# Patient Record
Sex: Female | Born: 1970 | Race: Black or African American | Hispanic: No | Smoking: Former smoker
Health system: Southern US, Community
[De-identification: ages and names within clinical notes are randomized; demographics above are authoritative.]

## PROBLEM LIST (undated history)

## (undated) DIAGNOSIS — F419 Anxiety disorder, unspecified: Secondary | ICD-10-CM

## (undated) DIAGNOSIS — Z720 Tobacco use: Secondary | ICD-10-CM

## (undated) DIAGNOSIS — F32A Depression, unspecified: Secondary | ICD-10-CM

## (undated) DIAGNOSIS — F329 Major depressive disorder, single episode, unspecified: Secondary | ICD-10-CM

## (undated) DIAGNOSIS — Z9884 Bariatric surgery status: Secondary | ICD-10-CM

## (undated) HISTORY — PX: LAPAROSCOPIC ROUX-EN-Y GASTRIC BYPASS WITH UPPER ENDOSCOPY AND REMOVAL OF LAP BAND: SHX6505

## (undated) HISTORY — DX: Bariatric surgery status: Z98.84

## (undated) HISTORY — PX: CHOLECYSTECTOMY: SHX55

## (undated) HISTORY — DX: Tobacco use: Z72.0

---

## 2009-03-07 DIAGNOSIS — Z9884 Bariatric surgery status: Secondary | ICD-10-CM

## 2009-03-07 HISTORY — DX: Bariatric surgery status: Z98.84

## 2016-06-08 ENCOUNTER — Encounter (HOSPITAL_COMMUNITY): Payer: Self-pay | Admitting: Emergency Medicine

## 2016-06-08 ENCOUNTER — Emergency Department (HOSPITAL_COMMUNITY)
Admission: EM | Admit: 2016-06-08 | Discharge: 2016-06-08 | Disposition: A | Discharge status: Home / Self Care | Payer: BLUE CROSS/BLUE SHIELD | Attending: Emergency Medicine | Admitting: Emergency Medicine

## 2016-06-08 DIAGNOSIS — Z760 Encounter for issue of repeat prescription: Secondary | ICD-10-CM | POA: Insufficient documentation

## 2016-06-08 DIAGNOSIS — F419 Anxiety disorder, unspecified: Secondary | ICD-10-CM | POA: Insufficient documentation

## 2016-06-08 DIAGNOSIS — Z87891 Personal history of nicotine dependence: Secondary | ICD-10-CM | POA: Insufficient documentation

## 2016-06-08 HISTORY — DX: Depression, unspecified: F32.A

## 2016-06-08 HISTORY — DX: Major depressive disorder, single episode, unspecified: F32.9

## 2016-06-08 HISTORY — DX: Anxiety disorder, unspecified: F41.9

## 2016-06-08 MED ORDER — CLONAZEPAM 0.5 MG PO TABS
1.0000 mg | ORAL_TABLET | Freq: Every day | ORAL | Status: DC
  Administered 2016-06-08: 1 mg via ORAL
  Filled 2016-06-08: qty 2

## 2016-06-08 MED ORDER — HYDROXYZINE HCL 25 MG PO TABS: 25.0000 mg | ORAL_TABLET | Freq: Four times a day (QID) | ORAL | 0 refills | Status: DC

## 2016-06-08 NOTE — ED Provider Notes (Signed)
[facility]-EMER[facility]ENCY DEPT Provider Note   CSN: [id number] Arrival date & time: 06/08/16  0708     History   Chief Complaint Chief Complaint  Patient presents with  . Medication Refill    HPI Valkyrie Guardiola is a 46 y.o. female who presents with worsening anxiety. PMH significant for anxiety/depression. She is on Lexapro and Klonipin. She states she has been on benzos for "years". She is from [place] but was visiting family in [place] for Easter and came here afterwards for a job interview. She realized she lost her medication in [facility] and called her family but they were unable to find it. She has been out of her medicine for the past 4 days. Since then she has had worsening anxiety and neck and upper back  "stiffness". She states she has had these symptoms before when she has been out of her meds. She was unsure of what to do so she came to the ED. No SI/HI.  HPI  Past Medical History:  Diagnosis Date  . Anxiety   . Depression     There are no active problems to display for this patient.   Past Surgical History:  Procedure Laterality Date  . CHOLECYSTECTOMY    . LAPAROSCOPIC ROUX-EN-Y GASTRIC BYPASS WITH UPPER ENDOSCOPY AND REMOVAL OF LAP BAND      OB History    No data available       Home Medications    Prior to Admission medications   Not on File    Family History No family history on file.  Social History Social History  Substance Use Topics  . Smoking status: Former Smoker    Types: Cigarettes  . Smokeless tobacco: Never Used  . Alcohol use Yes     Allergies   Patient has no allergy information on record.   Review of Systems Review of Systems  Musculoskeletal: Positive for arthralgias, myalgias and neck pain.  Neurological: Positive for tremors.  Psychiatric/Behavioral: Negative for self-injury and suicidal ideas. The patient is nervous/anxious.   All other systems reviewed and are negative.    Physical Exam Updated Vital Signs BP (!) 142/106 (BP  Location: Left Arm)   Pulse 92   Temp 98.7 F (37.1 C) (Oral)   Resp 18   Ht  (1.626 m)   Wt 81.6 kg   SpO2 100%   BMI 30.90 kg/m   Physical Exam  Constitutional: She is oriented to person, place, and time. She appears well-developed and well-nourished. No distress.  Pleasant, cooperative  HENT:  Head: Normocephalic and atraumatic.  Eyes: Conjunctivae are normal. Pupils are equal, round, and reactive to light. Right eye exhibits no discharge. Left eye exhibits no discharge. No scleral icterus.  Neck: Normal range of motion.  Cardiovascular: Normal rate.   Pulmonary/Chest: Effort normal. No respiratory distress.  Abdominal: She exhibits no distension.  Neurological: She is alert and oriented to person, place, and time.  Skin: Skin is warm and dry.  Psychiatric: Her behavior is normal. Her mood appears anxious (and tearful at times).  Nursing note and vitals reviewed.    ED Treatments / Results  Labs (all labs ordered are listed, but only abnormal results are displayed) Labs Reviewed - No data to display  EKG  EKG Interpretation None       Radiology No results found.  Procedures Procedures (including critical care time)  Medications Ordered in ED Medications - No data to display   Initial Impression / Assessment and Plan / ED Course  I have reviewed the triage vital signs and the nursing notes.  Pertinent labs & imaging results that were available during my care of the patient were reviewed by me and considered in my medical decision making (see chart for details).  46 year old female presents with request for med refill and symptoms of mild withdrawal. She is mildly hypertensive but otherwise vitals are normal. Discussed I cannot refill this medicine since it is a controlled substance. She verbalized understanding. I did give one dose of her home med here to counteract any withdrawal symptoms. Also gave rx for Atarax. Advised follow up with PCP.  Final  Clinical Impressions(s) / ED Diagnoses   Final diagnoses:  Medication refill  Anxiety    New Prescriptions New Prescriptions   No medications on file     Bethel Born, PA-C 06/08/16 1503    Linwood Dibbles, MD 06/08/16 [phone]

## 2016-06-08 NOTE — ED Notes (Signed)
Discharge instructions, follow up care, and rx x1 reviewed with patient. Patient verbalized understanding. 

## 2016-06-08 NOTE — ED Notes (Signed)
ED Provider at bedside. 

## 2016-06-08 NOTE — ED Notes (Signed)
Bed: [facility] Expected date:  Expected time:  Means of arrival:  Comments: 

## 2016-06-08 NOTE — ED Triage Notes (Signed)
Patient flew from [place] for a job interview on Monday. Patient is on klonopin .1 mg. Patient has not had this medication since this Saturday. Patient reports back and neck pain. Patient is conscious, alert, oriented. Denies SI/HI.

## 2016-06-08 NOTE — Discharge Instructions (Signed)
Take Atarax as needed  Please call your doctor when you get home

## 2016-08-18 ENCOUNTER — Encounter (HOSPITAL_COMMUNITY): Payer: Self-pay | Admitting: Emergency Medicine

## 2016-08-18 ENCOUNTER — Ambulatory Visit (HOSPITAL_COMMUNITY): Admission: EM | Admit: 2016-08-18 | Discharge: 2016-08-18 | Discharge status: Absent without leave | Payer: Self-pay

## 2016-08-18 ENCOUNTER — Ambulatory Visit (HOSPITAL_COMMUNITY)
Admission: EM | Admit: 2016-08-18 | Discharge: 2016-08-18 | Disposition: A | Discharge status: Home / Self Care | Payer: BLUE CROSS/BLUE SHIELD | Attending: Internal Medicine | Admitting: Internal Medicine

## 2016-08-18 DIAGNOSIS — J209 Acute bronchitis, unspecified: Secondary | ICD-10-CM

## 2016-08-18 MED ORDER — BENZONATATE 100 MG PO CAPS: 100.0000 mg | ORAL_CAPSULE | Freq: Three times a day (TID) | ORAL | 0 refills | Status: DC

## 2016-08-18 MED ORDER — PREDNISONE 50 MG PO TABS: ORAL_TABLET | ORAL | 0 refills | Status: DC

## 2016-08-18 NOTE — Discharge Instructions (Signed)
I am treating you for bronchitis. I have prescribed a steroid called prednisone. Take one tablet daily with food. For cough, I have prescribed a medication called Tessalon. Take 1 tablet every 8 hours as needed for your cough. Should your symptoms fail to improve or worsen, follow up with your primary care provider, or return to clinic.

## 2016-08-18 NOTE — ED Triage Notes (Signed)
Pt reports a productive cough for the last three days.  Pt is a smoker and gets recurrent bronchitis.  Pt taking Robitussin OTC.

## 2016-08-18 NOTE — ED Provider Notes (Signed)
CSN: [id number]     Arrival date & time 08/18/16  1145 History   None    Chief Complaint  Patient presents with  . Cough   (Consider location/radiation/quality/duration/timing/severity/associated sxs/prior Treatment) The history is provided by the patient.  Cough  Cough characteristics:  Non-productive, dry and hacking Sputum characteristics:  Clear Severity:  Moderate Onset quality:  Gradual Duration:  3 days Timing:  Constant Chronicity:  Recurrent Smoker: yes   Context: smoke exposure and upper respiratory infection   Context: not exposure to allergens   Relieved by:  Cough suppressants Associated symptoms: sinus congestion and wheezing   Associated symptoms: no chest pain, no chills, no ear fullness, no ear pain, no fever, no rhinorrhea and no shortness of breath     Past Medical History:  Diagnosis Date  . Anxiety   . Depression    Past Surgical History:  Procedure Laterality Date  . CHOLECYSTECTOMY    . LAPAROSCOPIC ROUX-EN-Y GASTRIC BYPASS WITH UPPER ENDOSCOPY AND REMOVAL OF LAP BAND     History reviewed. No pertinent family history. Social History  Substance Use Topics  . Smoking status: Current Every Day Smoker    Packs/day: 0.50    Types: Cigarettes  . Smokeless tobacco: Never Used  . Alcohol use Yes   OB History    No data available     Review of Systems  Constitutional: Negative for chills and fever.  HENT: Negative for ear pain and rhinorrhea.   Respiratory: Positive for cough and wheezing. Negative for shortness of breath.   Cardiovascular: Negative for chest pain.  Gastrointestinal: Negative.   Musculoskeletal: Negative.   Skin: Negative.   Neurological: Negative.     Allergies  Patient has no known allergies.  Home Medications   Prior to Admission medications   Medication Sig Start Date End Date Taking? Authorizing Provider  busPIRone (BUSPAR) 10 MG tablet Take 10 mg by mouth 3 (three) times daily.   Yes [provider]   escitalopram (LEXAPRO) 20 MG tablet Take 20 mg by mouth daily.   Yes [provider]  hydrOXYzine (ATARAX/VISTARIL) 25 MG tablet Take 1 tablet (25 mg total) by mouth every 6 (six) hours. 06/08/16  Yes Bethel Born, PA-C  benzonatate (TESSALON) 100 MG capsule Take 1 capsule (100 mg total) by mouth every 8 (eight) hours. 08/18/16   Dorena Bodo, NP  predniSONE (DELTASONE) 50 MG tablet Take 1 tablet daily with food 08/18/16   Dorena Bodo, NP   Meds Ordered and Administered this Visit  Medications - No data to display  BP 115/79 (BP Location: Right Arm)   Pulse 86   Temp 98.1 F (36.7 C) (Oral)   SpO2 99%  No data found.   Physical Exam  Constitutional: She is oriented to person, place, and time. She appears well-developed and well-nourished. No distress.  HENT:  Head: Normocephalic and atraumatic.  Right Ear: Tympanic membrane and external ear normal.  Left Ear: Tympanic membrane and external ear normal.  Nose: Nose normal.  Mouth/Throat: Uvula is midline, oropharynx is clear and moist and mucous membranes are normal.  Eyes: Conjunctivae are normal.  Neck: Normal range of motion. Neck supple.  Cardiovascular: Normal rate and regular rhythm.   Pulmonary/Chest: Effort normal and breath sounds normal. She has no wheezes.  Lymphadenopathy:    She has no cervical adenopathy.  Neurological: She is alert and oriented to person, place, and time.  Skin: Skin is warm and dry. Capillary refill takes less than 2 seconds.  No rash noted. She is not diaphoretic. No erythema.  Psychiatric: She has a normal mood and affect. Her behavior is normal.  Nursing note and vitals reviewed.   Urgent Care Course     Procedures (including critical care time)  Labs Review Labs Reviewed - No data to display  Imaging Review No results found.      MDM   1. Acute bronchitis, unspecified organism    Given prednisone, Tessalon, provided counseling on over-the-counter  therapies for symptom management, follow-up with primary care or return to clinic as needed.    Dorena BodoKennard, Ahonesty Woodfin, NP 08/18/16 1317

## 2016-08-23 ENCOUNTER — Telehealth: Payer: Self-pay | Admitting: General Practice

## 2016-08-23 NOTE — Telephone Encounter (Signed)
Called to sched new pt appt with Dr. Earlene PlaterWallace, left message  -LL

## 2016-09-16 ENCOUNTER — Ambulatory Visit: Payer: BLUE CROSS/BLUE SHIELD | Admitting: Family Medicine

## 2016-09-29 ENCOUNTER — Ambulatory Visit (INDEPENDENT_AMBULATORY_CARE_PROVIDER_SITE_OTHER): Payer: 59 | Admitting: Family Medicine

## 2016-09-29 ENCOUNTER — Encounter: Payer: Self-pay | Admitting: Family Medicine

## 2016-09-29 VITALS — HR 90 | Ht 64.0 in | Wt 180.0 lb

## 2016-09-29 DIAGNOSIS — R05 Cough: Secondary | ICD-10-CM

## 2016-09-29 DIAGNOSIS — F17200 Nicotine dependence, unspecified, uncomplicated: Secondary | ICD-10-CM | POA: Insufficient documentation

## 2016-09-29 DIAGNOSIS — Z720 Tobacco use: Secondary | ICD-10-CM | POA: Diagnosis not present

## 2016-09-29 DIAGNOSIS — Z9884 Bariatric surgery status: Secondary | ICD-10-CM

## 2016-09-29 DIAGNOSIS — R059 Cough, unspecified: Secondary | ICD-10-CM | POA: Insufficient documentation

## 2016-09-29 DIAGNOSIS — F419 Anxiety disorder, unspecified: Secondary | ICD-10-CM

## 2016-09-29 HISTORY — DX: Tobacco use: Z72.0

## 2016-09-29 MED ORDER — HYDROXYZINE HCL 25 MG PO TABS: 25.0000 mg | ORAL_TABLET | Freq: Four times a day (QID) | ORAL | 3 refills | Status: DC

## 2016-09-29 MED ORDER — DOXYCYCLINE HYCLATE 100 MG PO TABS: 100.0000 mg | ORAL_TABLET | Freq: Two times a day (BID) | ORAL | 0 refills | Status: DC

## 2016-09-29 MED ORDER — ALBUTEROL SULFATE HFA 108 (90 BASE) MCG/ACT IN AERS: 2.0000 | INHALATION_SPRAY | Freq: Four times a day (QID) | RESPIRATORY_TRACT | 2 refills | Status: DC | PRN

## 2016-09-29 MED ORDER — BUSPIRONE HCL 10 MG PO TABS
10.0000 mg | ORAL_TABLET | Freq: Three times a day (TID) | ORAL | 3 refills | Status: DC
Start: 2016-09-29 — End: 2017-12-22

## 2016-09-29 MED ORDER — ESCITALOPRAM OXALATE 20 MG PO TABS
20.0000 mg | ORAL_TABLET | Freq: Every day | ORAL | 5 refills | Status: DC
Start: 2016-09-29 — End: 2017-07-06

## 2016-09-29 NOTE — Patient Instructions (Signed)
You will be contacted to schedule your bariatric appointment.  Have a wonderful day!

## 2016-09-29 NOTE — Progress Notes (Signed)
Samantha RedwoodDionne Kyung RuddKennedy is a 46 y.o. [facility]   Patient Care Team: Helane RimaWallace, Latreece Mochizuki, DO as PCP - General (Family Medicine)   History of Present Illness:   Samantha ArtAmber Agner, CMA, acting as scribe for Dr. Earlene PlaterWallace.  HPI:  Patient [place]  She would like to establish care today.  States she has a chronic cough that is productive of sputum.  She was treated with a steroid and Tessalon.  The cough has not improved.  Patient is a smoker.  She has some intermittent wheezing.  Cough is worse at night.  Some difficulty taking a deep breath.  Patient takes hydroxyzine as needed.  States she has a history of being treated with Xanax and Lexapro.  She abruptly stopped the Xanax after being on it for 5 years because she ran out of it and she was very sick.  She ended up in the ER and was started on Buspar.  She has not noticed that it has made that much of a difference.    Patient also states she has a lap band and would like referral to a bariatric specialist in order to have this maintained.  She had the lap band placed in November 2011.   Patient is a smoker.  She has history of smoking and then stopped cold [place].  She started smoking again 6 months ago.  States she is not ready to quit at this time.   Health Maintenance Due  Topic Date Due  . TETANUS/TDAP  02/10/1989   Depression screen PHQ 2/9 09/29/2016  Decreased Interest 0  Down, Depressed, Hopeless 0  PHQ - 2 Score 0   PMHx, SurgHx, SocialHx, Medications, and Allergies were reviewed in the Visit Navigator and updated as appropriate.   Past Medical History:  Diagnosis Date  . Anxiety   . Depression   . 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 Tobacco use 09/29/2016   Past Surgical History:  Procedure Laterality Date  . CHOLECYSTECTOMY    . LAPAROSCOPIC ROUX-EN-Y GASTRIC BYPASS WITH UPPER ENDOSCOPY AND REMOVAL OF LAP BAND     History reviewed. No  pertinent family history. Social History  Substance Use Topics  . Smoking status: Current Every Day Smoker    Packs/day: 0.50    Types: Cigarettes  . Smokeless tobacco: Never Used  . Alcohol use 8.4 oz/week    14 Glasses of wine per week   Current Medications and Allergies:   .  busPIRone (BUSPAR) 10 MG tablet, Take 1 tablet (10 mg total) by mouth 3 (three) times daily., Disp: 90 tablet, Rfl: 3 .  escitalopram (LEXAPRO) 20 MG tablet, Take 1 tablet (20 mg total) by mouth daily., Disp: 30 tablet, Rfl: 5 .  hydrOXYzine (ATARAX/VISTARIL) 25 MG tablet, Take 1 tablet (25 mg total) by mouth every 6 (six) hours., Disp: 30 tablet, Rfl: 3  Allergies  Allergen Reactions  . Morphine And Related Nausea Only   Review of Systems:   Pertinent items are noted in the HPI. Otherwise, ROS is negative.  Vitals:   Vitals:   09/29/16 1336  Pulse: 90  SpO2: 97%  Weight: 180 lb (81.6 kg)  Height: 5\' 4"  (1.626 m)     Body mass index is 30.9 kg/m.  Physical Exam:   Physical Exam  Constitutional: She appears well-developed and well-nourished. No distress.  HENT:  Head: Normocephalic and atraumatic.  Eyes: Pupils are equal, round, and reactive to  light. EOM are normal.  Neck: Normal range of motion. Neck supple.  Cardiovascular: Normal rate, regular rhythm, normal heart sounds and intact distal pulses.   Pulmonary/Chest: Effort normal. She has wheezes. She has rhonchi in the right lower field.  Abdominal: Soft.  Skin: Skin is warm.  Psychiatric: She has a normal mood and affect. Her behavior is normal.  Nursing note and vitals reviewed.   Assessment and Plan:   Samantha Duarte was seen today for establish care.  Diagnoses and all orders for this visit:  Tobacco use Comments: Precontemplative. I advised patient to quit smoking, and offered support. [place] QUITLINE: 1-800-QUIT-NOW [phone](1-6232448932).  Anxiety Comments: Doing well. Okay to stop Buspar if not helping.  Orders: -     escitalopram  (LEXAPRO) 20 MG tablet; Take 1 tablet (20 mg total) by mouth daily. -     hydrOXYzine (ATARAX/VISTARIL) 25 MG tablet; Take 1 tablet (25 mg total) by mouth every 6 (six) hours prn anxiety. -     busPIRone (BUSPAR) 10 MG tablet; Take 1 tablet (10 mg total) by mouth 3 (three) times daily.  History of laparoscopic adjustable gastric banding Comments: No management in years. Wants re-evaluation.  Orders: -     Amb Referral to Bariatric Surgery  Cough Comments: Concern for development into bronchopneumonia. Reviewed symptomatic care and red flags. See below orders. Orders: -     doxycycline (VIBRA-TABS) 100 MG tablet; Take 1 tablet (100 mg total) by mouth 2 (two) times daily. -     albuterol (PROVENTIL HFA;VENTOLIN HFA) 108 (90 Base) MCG/ACT inhaler; Inhale 2 puffs into the lungs every 6 (six) hours as needed for wheezing or shortness of breath.    . Reviewed expectations re: course of current medical issues. . Discussed self-management of symptoms. . Outlined signs and symptoms indicating need for more acute intervention. . Patient verbalized understanding and all questions were answered. Marland Kitchen. Health Maintenance issues including appropriate healthy diet, exercise, and smoking avoidance were discussed with patient. . See orders for this visit as documented in the electronic medical record. . Patient received an After Visit Summary.  CMA served as Neurosurgeonscribe during this visit. History, Physical, and Plan performed by medical provider. The above documentation has been reviewed and is accurate and complete. Helane RimaErica Tyrin Herbers, D.O.  Helane RimaErica Amairany Schumpert, DO [place], [facility] 09/30/2016  Future Appointments Date Time Provider Department Center  03/29/2017 2:45 PM Helane RimaWallace, Kaliann Coryell, DO [facility] None

## 2016-09-30 ENCOUNTER — Encounter: Payer: Self-pay | Admitting: Family Medicine

## 2017-03-29 ENCOUNTER — Ambulatory Visit (INDEPENDENT_AMBULATORY_CARE_PROVIDER_SITE_OTHER): Payer: 59 | Admitting: Family Medicine

## 2017-03-29 ENCOUNTER — Encounter: Payer: Self-pay | Admitting: Family Medicine

## 2017-03-29 VITALS — BP 108/72 | HR 92 | Temp 98.3°F | Ht 64.0 in | Wt 166.8 lb

## 2017-03-29 DIAGNOSIS — Z9884 Bariatric surgery status: Secondary | ICD-10-CM | POA: Diagnosis not present

## 2017-03-29 DIAGNOSIS — Z13220 Encounter for screening for lipoid disorders: Secondary | ICD-10-CM

## 2017-03-29 DIAGNOSIS — E559 Vitamin D deficiency, unspecified: Secondary | ICD-10-CM

## 2017-03-29 DIAGNOSIS — R634 Abnormal weight loss: Secondary | ICD-10-CM | POA: Diagnosis not present

## 2017-03-29 DIAGNOSIS — Z0000 Encounter for general adult medical examination without abnormal findings: Secondary | ICD-10-CM

## 2017-03-29 DIAGNOSIS — F17200 Nicotine dependence, unspecified, uncomplicated: Secondary | ICD-10-CM | POA: Diagnosis not present

## 2017-03-29 DIAGNOSIS — N912 Amenorrhea, unspecified: Secondary | ICD-10-CM | POA: Diagnosis not present

## 2017-03-29 DIAGNOSIS — Z79899 Other long term (current) drug therapy: Secondary | ICD-10-CM | POA: Diagnosis not present

## 2017-03-29 MED ORDER — NICOTINE 14 MG/24HR TD PT24: 14.0000 mg | MEDICATED_PATCH | Freq: Every day | TRANSDERMAL | 5 refills | Status: DC

## 2017-03-29 NOTE — Patient Instructions (Signed)
   [place] QUITLINE: 1-800-QUIT-NOW (1-800-784-8669)  

## 2017-03-29 NOTE — Progress Notes (Signed)
Subjective:    Samantha Duarte is a 47 y.o. female and is here for a comprehensive physical exam.  Pertinent Gynecological History: No LMP recorded. History of ablation.  There are no preventive care reminders to display for this patient.   PMHx, SurgHx, SocialHx, Medications, and Allergies were reviewed in the Visit Navigator and updated as appropriate.   Past Medical History:  Diagnosis Date  . Anxiety   . Depression   . 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 Tobacco use 09/29/2016    Past Surgical History:  Procedure Laterality Date  . CHOLECYSTECTOMY    . LAPAROSCOPIC ROUX-EN-Y GASTRIC BYPASS WITH UPPER ENDOSCOPY AND REMOVAL OF LAP BAND     No family history on file. Social History   Tobacco Use  . Smoking status: Current Every Day Smoker    Packs/day: 0.50    Types: Cigarettes  . Smokeless tobacco: Never Used  Substance Use Topics  . Alcohol use: Yes    Alcohol/week: 8.4 oz    Types: 14 Glasses of wine per week  . Drug use: No    Review of Systems:   Pertinent items are noted in the HPI. Otherwise, ROS is negative.  Objective:   BP 108/72   Pulse 92   Temp 98.3 F (36.8 C) (Oral)   Ht 5\' 4"  (1.626 m)   Wt 166 lb 12.8 oz (75.7 kg)   SpO2 99%   BMI 28.63 kg/m    Wt Readings from Last 3 Encounters:  03/29/17 166 lb 12.8 oz (75.7 kg)  09/29/16 180 lb (81.6 kg)  06/08/16 180 lb (81.6 kg)     Ht Readings from Last 3 Encounters:  03/29/17 5\' 4"  (1.626 m)  09/29/16 5\' 4"  (1.626 m)  06/08/16 5\' 4"  (1.626 m)    General appearance: alert, cooperative and appears stated age. Head: normocephalic, without obvious abnormality, atraumatic. Neck: no adenopathy, supple, symmetrical, trachea midline; thyroid not enlarged, symmetric, no tenderness/mass/nodules. Lungs: clear to auscultation bilaterally. Heart: regular rate and rhythm Abdomen: soft, non-tender; no masses,  no organomegaly. Extremities: extremities normal, atraumatic,  no cyanosis or edema. Skin: skin color, texture, turgor normal, no rashes or lesions. Lymph: cervical, supraclavicular, and axillary nodes normal; no abnormal inguinal nodes palpated. Neurologic: grossly normal.                                 Assessment/Plan:   Diagnoses and all orders for this visit:  Routine physical examination -     CBC with Differential/Platelet -     Comprehensive metabolic panel  Smoker Comments: Action stage of change. See below treatment. QUITLINE information provided.  Orders: -     nicotine (NICODERM CQ - DOSED IN MG/24 HOURS) 14 mg/24hr patch; Place 1 patch (14 mg total) onto the skin daily.  History of laparoscopic adjustable gastric banding Comments: Not taking multivitamins. Reminder to take them and will check vitamin/minerals. Orders: -     Vitamin B12 -     Ambulatory referral to General Surgery  Screening for lipid disorders -     Lipid panel  Weight loss Comments: Continues. Unintentional. Needs recheck, Hx of lap band. Will put in referral again.  Orders: -     TSH  Vitamin D deficiency -     VITAMIN D 25 Hydroxy (Vit-D Deficiency, Fractures)  Medication management -     Vitamin B12  Amenorrhea Comments: Patient with vasomotor symptoms. Hx  of uterine ablation. Will check FSH/LH to confirm menopausal status.  Orders: -     FSH -     Luteinizing hormone    Patient Counseling: [x]    Nutrition: Stressed importance of moderation in sodium/caffeine intake, saturated fat and cholesterol, caloric balance, sufficient intake of fresh fruits, vegetables, fiber, calcium, iron, and 1 mg of folate supplement per day (for females capable of pregnancy).  [x]    Stressed the importance of regular exercise.   [x]    Substance Abuse: Discussed cessation/primary prevention of tobacco, alcohol, or other drug use; driving or other dangerous activities under the influence; availability of treatment for abuse.   [x]    Injury prevention: Discussed  safety belts, safety helmets, smoke detector, smoking near bedding or upholstery.   [x]    Sexuality: Discussed sexually transmitted diseases, partner selection, use of condoms, avoidance of unintended pregnancy  and contraceptive alternatives.  [x]    Dental health: Discussed importance of regular tooth brushing, flossing, and dental visits.  [x]    Health maintenance and immunizations reviewed. Please refer to Health maintenance section.   Helane RimaErica Sejla Marzano, DO [place] [facility]

## 2017-03-30 LAB — LIPID PANEL
Cholesterol: 180 mg/dL (ref 0–200)
HDL: 87.6 mg/dL (ref >39.00)
LDL Cholesterol: 72 mg/dL (ref 0–99)
NonHDL: 92.3
Total CHOL/HDL Ratio: 2
Triglycerides: 103 mg/dL (ref 0.0–149.0)
VLDL: 20.6 mg/dL (ref 0.0–40.0)

## 2017-03-30 LAB — CBC WITH DIFFERENTIAL/PLATELET
Basophils Absolute: 0.1 10*3/uL (ref 0.0–0.1)
Basophils Relative: 1 % (ref 0.0–3.0)
Eosinophils Absolute: 0.3 10*3/uL (ref 0.0–0.7)
Eosinophils Relative: 4.4 % (ref 0.0–5.0)
HCT: 41.2 % (ref 36.0–46.0)
Hemoglobin: 14.1 g/dL (ref 12.0–15.0)
Lymphocytes Relative: 47.8 % — ABNORMAL HIGH (ref 12.0–46.0)
Lymphs Abs: 3 10*3/uL (ref 0.7–4.0)
MCHC: 34.3 g/dL (ref 30.0–36.0)
MCV: 98.6 fl (ref 78.0–100.0)
Monocytes Absolute: 0.4 10*3/uL (ref 0.1–1.0)
Monocytes Relative: 6.1 % (ref 3.0–12.0)
Neutro Abs: 2.6 10*3/uL (ref 1.4–7.7)
Neutrophils Relative %: 40.7 % — ABNORMAL LOW (ref 43.0–77.0)
Platelets: 262 10*3/uL (ref 150.0–400.0)
RBC: 4.18 Mil/uL (ref 3.87–5.11)
RDW: 14.8 % (ref 11.5–15.5)
WBC: 6.4 10*3/uL (ref 4.0–10.5)

## 2017-03-30 LAB — COMPREHENSIVE METABOLIC PANEL
ALT: 9 U/L (ref 0–35)
AST: 13 U/L (ref 0–37)
Albumin: 4.2 g/dL (ref 3.5–5.2)
Alkaline Phosphatase: 62 U/L (ref 39–117)
BUN: 14 mg/dL (ref 6–23)
CO2: 30 mEq/L (ref 19–32)
Calcium: 9.6 mg/dL (ref 8.4–10.5)
Chloride: 104 mEq/L (ref 96–112)
Creatinine, Ser: 0.94 mg/dL (ref 0.40–1.20)
GFR: 82.04 mL/min (ref >60.00)
Glucose, Bld: 82 mg/dL (ref 70–99)
Potassium: 3.9 mEq/L (ref 3.5–5.1)
Sodium: 140 mEq/L (ref 135–145)
Total Bilirubin: 0.4 mg/dL (ref 0.2–1.2)
Total Protein: 7.1 g/dL (ref 6.0–8.3)

## 2017-03-30 LAB — LUTEINIZING HORMONE: LH: 26.53 m[IU]/mL

## 2017-03-30 LAB — VITAMIN D 25 HYDROXY (VIT D DEFICIENCY, FRACTURES): VITD: 32.02 ng/mL (ref 30.00–100.00)

## 2017-03-30 LAB — VITAMIN B12: Vitamin B-12: 341 pg/mL (ref 211–911)

## 2017-03-30 LAB — FOLLICLE STIMULATING HORMONE: FSH: 51.1 m[IU]/mL

## 2017-03-30 LAB — TSH: TSH: 0.38 u[IU]/mL (ref 0.35–4.50)

## 2017-04-02 ENCOUNTER — Encounter: Payer: Self-pay | Admitting: Family Medicine

## 2017-07-06 ENCOUNTER — Other Ambulatory Visit: Payer: Self-pay | Admitting: Family Medicine

## 2017-07-06 DIAGNOSIS — F419 Anxiety disorder, unspecified: Secondary | ICD-10-CM

## 2017-10-09 ENCOUNTER — Encounter: Payer: Self-pay | Admitting: Family Medicine

## 2017-10-09 ENCOUNTER — Ambulatory Visit (INDEPENDENT_AMBULATORY_CARE_PROVIDER_SITE_OTHER): Payer: 59 | Admitting: Family Medicine

## 2017-10-09 VITALS — BP 110/68 | HR 101 | Temp 98.6°F | Ht 64.0 in | Wt 154.0 lb

## 2017-10-09 DIAGNOSIS — M5431 Sciatica, right side: Secondary | ICD-10-CM | POA: Diagnosis not present

## 2017-10-09 DIAGNOSIS — F419 Anxiety disorder, unspecified: Secondary | ICD-10-CM

## 2017-10-09 DIAGNOSIS — F5104 Psychophysiologic insomnia: Secondary | ICD-10-CM

## 2017-10-09 DIAGNOSIS — F17210 Nicotine dependence, cigarettes, uncomplicated: Secondary | ICD-10-CM | POA: Diagnosis not present

## 2017-10-09 MED ORDER — TRAZODONE HCL 50 MG PO TABS: 25.0000 mg | ORAL_TABLET | Freq: Every evening | ORAL | 2 refills | Status: DC | PRN

## 2017-10-09 MED ORDER — CYCLOBENZAPRINE HCL 5 MG PO TABS: 5.0000 mg | ORAL_TABLET | Freq: Three times a day (TID) | ORAL | 0 refills | Status: DC | PRN

## 2017-10-09 MED ORDER — KETOROLAC TROMETHAMINE 10 MG PO TABS: 10.0000 mg | ORAL_TABLET | Freq: Four times a day (QID) | ORAL | 0 refills | Status: DC | PRN

## 2017-10-09 MED ORDER — PREDNISONE 5 MG PO TABS: 5.0000 mg | ORAL_TABLET | Freq: Every day | ORAL | 0 refills | Status: DC

## 2017-10-09 NOTE — Progress Notes (Signed)
Samantha Duarte is a 47 y.o. female here for an acute visit.  History of Present Illness:   Samantha Duarte, CMA acting as scribe for Dr. Helane RimaErica Derrisha Foos.   HPI: Patient in office for evaluation of back pain. Pain started after plain ride to have pain into back of thigh front of shin to top of foot with some numbness into toes. She has tried ibuprofen, flexeril and heat with very little improvement.   PMHx, SurgHx, SocialHx, Medications, and Allergies were reviewed in the Visit Navigator and updated as appropriate.  Current Medications:    .  busPIRone (BUSPAR) 10 MG tablet, Take 1 tablet (10 mg total) by mouth 3 (three) times daily., Disp: 90 tablet, Rfl: 3 .  escitalopram (LEXAPRO) 20 MG tablet, TAKE 1 TABLET (20 MG TOTAL) BY MOUTH DAILY., Disp: 30 tablet, Rfl: 5 .  hydrOXYzine (ATARAX/VISTARIL) 25 MG tablet, Take 1 tablet (25 mg total) by mouth every 6 (six) hours., Disp: 30 tablet, Rfl: 3 .  ranitidine (ZANTAC) 150 MG capsule, Take 150 mg by mouth 2 (two) times daily., Disp: , Rfl:    Allergies  Allergen Reactions  . Morphine And Related Nausea Only   Review of Systems:   Pertinent items are noted in the HPI. Otherwise, ROS is negative.  Vitals:   Vitals:   10/09/17 1052  BP: 110/68  Pulse: (!) 101  Temp: 98.6 F (37 C)  TempSrc: Oral  SpO2: 100%  Weight: 154 lb (69.9 kg)  Height: 5\' 4"  (1.626 m)     Body mass index is 26.43 kg/m.  Physical Exam:   Physical Exam  Constitutional: She appears well-nourished.  HENT:  Head: Normocephalic and atraumatic.  Eyes: Pupils are equal, round, and reactive to light. EOM are normal.  Neck: Normal range of motion. Neck supple.  Cardiovascular: Normal rate, regular rhythm, normal heart sounds and intact distal pulses.  Pulmonary/Chest: Effort normal.  Abdominal: Soft.  Musculoskeletal:       Lumbar back: She exhibits decreased range of motion, pain and spasm.       Back:  Skin: Skin is warm.  Psychiatric: She has a  normal mood and affect. Her behavior is normal.  Nursing note and vitals reviewed.   Assessment and Plan:   Samantha Duarte was seen today for back pain.  Diagnoses and all orders for this visit:  Anxiety Comments: Situational. Worsened. Discussed the importance of self-care. Offered referral to [facility]. She accepted.  Right sided sciatica Comments: NEW. With concern for mild foot drop. See orders below. Will need imaging if not improving.  Orders: -     cyclobenzaprine (FLEXERIL) 5 MG tablet; Take 1 tablet (5 mg total) by mouth 3 (three) times daily as needed for muscle spasms. -     predniSONE (DELTASONE) 5 MG tablet; Take 1 tablet (5 mg total) by mouth daily with breakfast. 6-5-4-3-2-1 -     ketorolac (TORADOL) 10 MG tablet; Take 1 tablet (10 mg total) by mouth every 6 (six) hours as needed.  Psychophysiological insomnia Comments: See orders.  Orders: -     traZODone (DESYREL) 50 MG tablet; Take 0.5-1 tablets (25-50 mg total) by mouth at bedtime as needed for sleep.  Cigarette nicotine dependence without complication Comments: Precontemplative.     . Reviewed expectations re: course of current medical issues. . Discussed self-management of symptoms. . Outlined signs and symptoms indicating need for more acute intervention. . Patient verbalized understanding and all questions were answered. Marland Kitchen. Health Maintenance issues including appropriate healthy  diet, exercise, and smoking avoidance were discussed with patient. . See orders for this visit as documented in the electronic medical record. . Patient received an After Visit Summary.  CMA served as Neurosurgeon during this visit. History, Physical, and Plan performed by medical provider. The above documentation has been reviewed and is accurate and complete. Helane Rima, D.O.  Helane Rima, DO [place] 10/11/2017

## 2017-10-11 ENCOUNTER — Encounter: Payer: Self-pay | Admitting: Family Medicine

## 2017-10-18 ENCOUNTER — Ambulatory Visit (INDEPENDENT_AMBULATORY_CARE_PROVIDER_SITE_OTHER): Payer: 59 | Admitting: Sports Medicine

## 2017-10-18 ENCOUNTER — Ambulatory Visit (INDEPENDENT_AMBULATORY_CARE_PROVIDER_SITE_OTHER): Payer: 59

## 2017-10-18 ENCOUNTER — Encounter: Payer: Self-pay | Admitting: Sports Medicine

## 2017-10-18 VITALS — BP 112/84 | HR 96 | Ht 64.0 in | Wt 155.4 lb

## 2017-10-18 DIAGNOSIS — M5416 Radiculopathy, lumbar region: Secondary | ICD-10-CM

## 2017-10-18 DIAGNOSIS — M5136 Other intervertebral disc degeneration, lumbar region: Secondary | ICD-10-CM | POA: Diagnosis not present

## 2017-10-18 NOTE — Patient Instructions (Signed)

## 2017-10-18 NOTE — Progress Notes (Signed)
Samantha Duarte D. Delorise Shinerigby, DO  [facility] [place] [name]  Samantha PyoDionne Duarte - 47 y.o. female MRN [id number]  Date of birth: 11/16/1970  Visit Date: 10/18/2017  PCP: Helane RimaWallace, Erica, DO   Referred by: Helane RimaWallace, Erica, DO  Scribe(s) for today's visit: Christoper FabianMolly Weber, LAT, ATC  SUBJECTIVE:  Samantha PyoDionne Samantha Duarte is here for New Patient (Initial Visit) (Low back pain w/ R sciatica)  Her low back and R LE symptoms INITIALLY: Began about 2 weeks ago after a long [place] ride from [place] when she started having back pain w/ radiating pain into her R post thigh and R ant tibia and foot.  The radiating R LE pain has resolved but she now notes some weakness in her R ankle dorsiflexors.  She also reports almost falling several times.  She states that in trying to prevent herself from falling, she jarred her L hip which is also currently bothering her. Described as 6/10 dull,achy throbbing pain in the L hip and R lumbar spine, nonradiating Worsened with walking Improved with rest and Toradol Additional associated symptoms include: N/T into R LE and R ankle DF weakness; no change in B/B and no change in symptoms w/ coughing/sneezing    At this time symptoms are slightly better due to improved R ankle DF strength compared to onset. She has been taking Flexeril 5mg  and Toradol 10 mg.  She has finished her prednisone.   REVIEW OF SYSTEMS: Reports night time disturbances. Denies fevers, chills, or night sweats. Denies unexplained weight loss. Denies personal history of cancer. Denies changes in bowel or bladder habits. Denies recent unreported falls. Denies new or worsening dyspnea or wheezing. Denies headaches or dizziness.  Reports numbness, tingling or weakness  In the extremities - in the R LE Denies dizziness or presyncopal episodes Denies lower extremity edema    HISTORY & PERTINENT PRIOR DATA:  Prior History reviewed and updated per electronic medical record.    Significant/pertinent history, findings, studies include:  reports that she has been smoking cigarettes. She has been smoking about 0.50 packs per day. She has never used smokeless tobacco. No results for input(s): HGBA1C, LABURIC, CREATINE in the last 8760 hours. No specialty comments available. No problems updated.  OBJECTIVE:  VS:  HT:5\' 4"  (162.6 cm)   WT:155 lb 6.4 oz (70.5 kg)  BMI:26.66    BP:112/84  HR:96bpm  TEMP: ( )  RESP:97 %   PHYSICAL EXAM: CONSTITUTIONAL: Well-developed, Well-nourished and In no acute distress Alert & appropriately interactive. and Not depressed or anxious appearing. Respiratory: No increased work of breathing.  Trachea Midline EYES: Pupils are equal., EOM intact without nystagmus. and No scleral icterus.  Lower EXTREMITY EXAM: Warm and well perfused Edema: No significant swelling or edema NEURO: unremarkable, Abnormal strength in Right hip flexors, knee extensors, ankle dorsiflexors and plantar flexors, Abnormal sensation in the L3 distribution dermatome., Abnormal DTR in the Right patellar and Achilles diminished at 1+/4.  MSK Exam: Negative suprapatellar reflex, Negative straight leg raise, no associated pain with eliciting maneuvers.  PROCEDURES & DATA REVIEWED:  none  ASSESSMENT   1. Lumbar back pain with radiculopathy affecting right lower extremity     PLAN:  Given the marked weakness and difficulty with lack of pain further diagnostic evaluation indicated at this time including plain film x-rays today and MRI of her lumbar spine.  If any lack of improvement can consider referral for nerve conduction studies after 3-week time.  She will call us if any  worsening symptoms and we will plan to send in a longer taper steroid for her if indicated.  Orders Placed This Encounter  Procedures  . DG Lumbar Spine Complete  . MR Lumbar Spine Wo Contrast  No orders of the defined types were placed in this encounter.    Follow-up: Return for  MRI results review.      Please see additional documentation for Objective, Assessment and Plan sections. Pertinent additional documentation may be included in corresponding procedure notes, imaging studies, problem based documentation and patient instructions. Please see these sections of the encounter for additional information regarding this visit.  CMA/ATC served as Neurosurgeonscribe during this visit. History, Physical, and Plan performed by medical provider. Documentation and orders reviewed and attested to.      Andrena MewsMichael D Brenden Rudman, DO    [facility] Physician

## 2017-11-01 ENCOUNTER — Ambulatory Visit
Admission: RE | Admit: 2017-11-01 | Discharge: 2017-11-01 | Disposition: A | Discharge status: Home / Self Care | Payer: 59 | Source: Ambulatory Visit | Attending: Sports Medicine | Admitting: Sports Medicine

## 2017-11-01 DIAGNOSIS — M545 Low back pain: Secondary | ICD-10-CM | POA: Diagnosis not present

## 2017-11-01 DIAGNOSIS — M5416 Radiculopathy, lumbar region: Secondary | ICD-10-CM

## 2017-11-13 ENCOUNTER — Telehealth: Payer: Self-pay

## 2017-11-13 NOTE — Telephone Encounter (Signed)
Copied from CRM [id number]. Topic: General - Other >> Nov 13, 2017 11:11 AM Marylen Ponto wrote: Reason for CRM: Pt called in for MRI results. Pt request call back.

## 2017-11-13 NOTE — Telephone Encounter (Signed)
Called pt and left VM to call the office. See CRM.

## 2017-11-16 NOTE — Telephone Encounter (Signed)
Spoke with patient and briefly discussed results. A copy has been faxed to pt at [phone] (marked confidential). OV has been scheduled for 11/27/17 to f/u with Dr. Berline Choughigby regarding MR results.

## 2017-11-22 NOTE — Progress Notes (Signed)
Has appointment 9/23 to go over results. [facility]

## 2017-11-23 ENCOUNTER — Other Ambulatory Visit: Payer: Self-pay | Admitting: Family Medicine

## 2017-11-23 ENCOUNTER — Encounter: Payer: Self-pay | Admitting: Sports Medicine

## 2017-11-23 ENCOUNTER — Ambulatory Visit (INDEPENDENT_AMBULATORY_CARE_PROVIDER_SITE_OTHER): Payer: 59 | Admitting: Sports Medicine

## 2017-11-23 VITALS — BP 150/100 | HR 84 | Ht 64.0 in | Wt 149.4 lb

## 2017-11-23 DIAGNOSIS — M5416 Radiculopathy, lumbar region: Secondary | ICD-10-CM | POA: Diagnosis not present

## 2017-11-23 DIAGNOSIS — M4727 Other spondylosis with radiculopathy, lumbosacral region: Secondary | ICD-10-CM | POA: Diagnosis not present

## 2017-11-23 DIAGNOSIS — M5431 Sciatica, right side: Secondary | ICD-10-CM

## 2017-11-23 DIAGNOSIS — M21371 Foot drop, right foot: Secondary | ICD-10-CM | POA: Diagnosis not present

## 2017-11-23 MED ORDER — GABAPENTIN 300 MG PO CAPS: ORAL_CAPSULE | ORAL | 1 refills | Status: DC

## 2017-11-23 NOTE — Progress Notes (Signed)
Samantha Duarte. Sam[facility] at [facility] [phone]  Samantha Duarte - 47 y.o. female MRN [id number]  Date of birth: 10-31-1970  Visit Date: 11/23/2017  PCP: Helane Rima, DO   Referred by: Helane Rima, DO  Scribe(s) for today's visit: Stevenson Clinch, CMA  SUBJECTIVE:  Samantha Duarte is here for Follow-up (LBP)   10/18/2017: Her low back and R LE symptoms INITIALLY: Began about 2 weeks ago after a long plane ride from [place] when she started having back pain w/ radiating pain into her R post thigh and R ant tibia and foot.  The radiating R LE pain has resolved but she now notes some weakness in her R ankle dorsiflexors.  She also reports almost falling several times.  She states that in trying to prevent herself from falling, she jarred her L hip which is also currently bothering her. Described as 6/10 dull,achy throbbing pain in the L hip and R lumbar spine, nonradiating Worsened with walking Improved with rest and Toradol Additional associated symptoms include: N/T into R LE and R ankle DF weakness; no change in B/B and no change in symptoms w/ coughing/sneezing   At this time symptoms are slightly better due to improved R ankle DF strength compared to onset. She has been taking Flexeril 5mg  and Toradol 10 mg.  She has finished her prednisone.  11/23/2017: Compared to the last office visit, her previously described symptoms are worsening. She has numbness in both feet, all toes on R foot and 1-2 toes on left foot. LBP is constant and dull. She denies sharp shooting pain. She reports increased falling and weakness.  Current symptoms are moderate-severe & are radiating to both lower extremities.  She has been taking Aleve BID with some relief. She ran out of flexeril but did find it beneficial while she was taking it.   REVIEW OF SYSTEMS: Reports night time disturbances. Denies fevers, chills, or night sweats. Denies unexplained  weight loss. Denies personal history of cancer. Denies changes in bowel or bladder habits. Reports recent unreported falls - 3 falls, tripped multiple times. No new injury d/t fall.  Denies new or worsening dyspnea or wheezing. Denies headaches or dizziness.  Reports numbness, tingling or weakness  In the extremities - in the R LE Denies dizziness or presyncopal episodes Denies lower extremity edema    HISTORY:  Prior history reviewed and updated per electronic medical record.  Social History   Occupational History  . Not on file  Tobacco Use  . Smoking status: Current Every Day Smoker    Packs/day: 0.50    Types: Cigarettes  . Smokeless tobacco: Never Used  Substance and Sexual Activity  . Alcohol use: Yes    Alcohol/week: 14.0 standard drinks    Types: 14 Glasses of wine per week  . Drug use: No  . Sexual activity: Yes   Social History   Social History Narrative  . Not on file    DATA OBTAINED & REVIEWED:  No results for input(s): HGBA1C, LABURIC, CREATINE in the last 8760 hours. Marland Kitchen MRI 10/24/2017: Bilateral facet arthropathy most notable at L4-5 with questionable stenosis at the right neuroforamina may be causing L5 radiculitis.  Normal-appearing lumbar disks with mild degenerative changes throughout the lumbar spine. .   OBJECTIVE:  VS:  HT:5\' 4"  (162.6 cm)   WT:149 lb 6.4 oz (67.8 kg)  BMI:25.63    BP:(!) 150/100  HR:84bpm  TEMP: ( )  RESP:100 %  PHYSICAL EXAM: CONSTITUTIONAL: Well-developed, Well-nourished and In no acute distress PSYCHIATRIC: Alert & appropriately interactive. and Not depressed or anxious appearing. RESPIRATORY: No increased work of breathing and Trachea Midline EYES: Pupils are equal., EOM intact without nystagmus. and No scleral icterus.  VASCULAR EXAM: Warm and well perfused NEURO: unremarkable Normal sensation to light touch Abnormal strength in L5 and S1 distribution with weakness with dorsiflexion and extensor hallucis longus  testing. Abnormal DTR in the Right Achilles  MSK Exam: BACK Exam: Normal alignment & Contours Skin: No overlying erythema/ecchymosis  MOTOR TESTING: Intact in all LE myotomes and Able to heel and toe walk without difficutly        RIGHT    LEFT Straight leg raise-------------------------: normal, no pain                         normal, no pain Braggard Stretch Test------------------: normal, no pain                         normal, no pain Slump Sign--------------------------------: normal, no pain                         normal, no pain Popliteal compression test------------: positive, moderate pain                         normal, no pain    REFLEXES Right Left  DTR - L3/4 -Patellar 2+ 2+  DTR - L5/S1 - Achilles trace 2+    ASSESSMENT   1. Lumbar back pain with radiculopathy affecting right lower extremity   2. Foot drop, right   3. Osteoarthritis of spine with radiculopathy, lumbosacral region     PLAN:  Pertinent additional documentation may be included in corresponding procedure notes, imaging studies, problem based documentation and patient instructions.  Procedures:  . None  Medications:  Meds ordered this encounter  Medications  . gabapentin (NEURONTIN) 300 MG capsule    Sig: Start with 1 tab po qhs X 1 week, then increase to 1 tab po bid X 1 week then 1 tab po tid prn    Dispense:  90 capsule    Refill:  1   Discussion/Instructions: No problem-specific Assessment & Plan notes found for this encounter.  . She continues to have ongoing weakness and pain within the right L5 and S1 distribution.  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 Marland Kitchen. Recommend over-the-counter B complex as well. . Referral placed Dr. Alvester MorinNewton for right-sided epidural steroid injection versus epidural plus  facet injection given synovial cyst . Discussed red flag symptoms that warrant earlier emergent evaluation and patient voices understanding. . Activity modifications and the importance of avoiding exacerbating activities (limiting pain to no more than a 4 / 10 during or following activity) recommended and discussed.  Follow-up:  . Return in about 6 weeks (around 01/04/2018).   . If any lack of improvement consider: further diagnostic evaluation with Nerve conduction studies and referral to Physical Therapy  .      CMA/ATC served as Neurosurgeonscribe during this visit. History, Physical, and Plan performed by medical provider. Documentation and orders reviewed and attested to.      Andrena MewsMichael D Leeland Lovelady,  DO    [facility] Phys[facility]

## 2017-11-24 NOTE — Telephone Encounter (Signed)
Please advise on refill. Patient saw Berline ChoughRigby yesterday.

## 2017-11-27 ENCOUNTER — Ambulatory Visit: Payer: 59 | Admitting: Sports Medicine

## 2017-12-08 ENCOUNTER — Encounter (INDEPENDENT_AMBULATORY_CARE_PROVIDER_SITE_OTHER): Payer: Self-pay | Admitting: Physical Medicine and Rehabilitation

## 2017-12-08 ENCOUNTER — Ambulatory Visit (INDEPENDENT_AMBULATORY_CARE_PROVIDER_SITE_OTHER): Payer: 59 | Admitting: Physical Medicine and Rehabilitation

## 2017-12-08 ENCOUNTER — Ambulatory Visit (INDEPENDENT_AMBULATORY_CARE_PROVIDER_SITE_OTHER): Payer: Self-pay

## 2017-12-08 VITALS — BP 144/98 | HR 98 | Temp 98.6°F

## 2017-12-08 DIAGNOSIS — M5416 Radiculopathy, lumbar region: Secondary | ICD-10-CM | POA: Diagnosis not present

## 2017-12-08 MED ORDER — BETAMETHASONE SOD PHOS & ACET 6 (3-3) MG/ML IJ SUSP
12.0000 mg | Freq: Once | INTRAMUSCULAR | Status: AC
  Administered 2017-12-08: 12 mg

## 2017-12-08 NOTE — Progress Notes (Signed)
 .  Numeric Pain Rating Scale and Functional Assessment Average Pain 8   In the last MONTH (on 0-10 scale) has pain interfered with the following?  1. General activity like being  able to carry out your everyday physical activities such as walking, climbing stairs, carrying groceries, or moving a chair?  Rating(5)   +Driver, -BT, -Dye Allergies.  

## 2017-12-08 NOTE — Patient Instructions (Signed)

## 2017-12-14 NOTE — Progress Notes (Signed)
Samantha Duarte - 47 y.o. female MRN [id number]  Date of birth: 1971-01-28  Office Visit Note: Visit Date: 12/08/2017 PCP: Helane Rima, DO Referred by: Helane Rima, DO  Subjective: Chief Complaint  Patient presents with  . Lower Back - Pain  . Right Leg - Pain, Numbness  . Right Foot - Numbness   HPI:  Samantha Duarte is a 47 y.o. female who comes in today For planned right L5 transforaminal epidural steroid injection.  She has been followed by Dr. Gaspar Bidding and her primary care physician Dr. Odelia Gage.  She reports chronic worsening pain in the right lower back that radiates into the right leg with numbness in the right leg and pretty classic L5 and S1 distribution.  This all began after a very long 9-hour flight to [place] in July 2019 and really has not gotten any better.  She reports walking makes the pain worse and ibuprofen can make it a little bit better.  Her average pain is 8 out of 10.  She has not responded to conservative care through Dr. Berline Chough.  And again on MRI was obtained and reviewed with the patient today.  We will see her back in a couple weeks and decide if we need to repeat the injection versus potential for facet joint block or other intervention.  ROS Otherwise per HPI.  Assessment & Plan: Visit Diagnoses:  1. Lumbar radiculopathy     Plan: No additional findings.   Meds & Orders:  Meds ordered this encounter  Medications  . betamethasone acetate-betamethasone sodium phosphate (CELESTONE) injection 12 mg    Orders Placed This Encounter  Procedures  . XR C-ARM NO REPORT  . Epidural Steroid injection    Follow-up: Return in about 2 weeks (around 12/22/2017) for possible repeat.   Procedures: No procedures performed  Lumbosacral Transforaminal Epidural Steroid Injection - Sub-Pedicular Approach with Fluoroscopic Guidance  Patient: Samantha Duarte      Date of Birth: [birth year] MRN: [id number] PCP: Helane Rima, DO      Visit Date: 12/08/2017   Universal Protocol:    Date/Time: 12/08/2017  Consent Given By: the patient  Position: PRONE  Additional Comments: Vital signs were monitored before and after the procedure. Patient was prepped and draped in the usual sterile fashion. The correct patient, procedure, and site was verified.   Injection Procedure Details:  Procedure Site One Meds Administered:  Meds ordered this encounter  Medications  . betamethasone acetate-betamethasone sodium phosphate (CELESTONE) injection 12 mg    Laterality: Right  Location/Site:  L5-S1  Needle size: 22 G  Needle type: Spinal  Needle Placement: Transforaminal  Findings:    -Comments: Excellent flow of contrast along the nerve and into the epidural space.  Procedure Details: After squaring off the end-plates to get a true AP view, the C-arm was positioned so that an oblique view of the foramen as noted above was visualized. The target area is just inferior to the "nose of the scotty dog" or sub pedicular. The soft tissues overlying this structure were infiltrated with 2-3 ml. of 1% Lidocaine without Epinephrine.  The spinal needle was inserted toward the target using a "trajectory" view along the fluoroscope beam.  Under AP and lateral visualization, the needle was advanced so it did not puncture dura and was located close the 6 O'Clock position of the pedical in AP tracterory. Biplanar projections were used to confirm position. Aspiration was confirmed to be negative for CSF and/or blood. A 1-2 ml. volume of Isovue-250  was injected and flow of contrast was noted at each level. Radiographs were obtained for documentation purposes.   After attaining the desired flow of contrast documented above, a 0.5 to 1.0 ml test dose of 0.25% Marcaine was injected into each respective transforaminal space.  The patient was observed for 90 seconds post injection.  After no sensory deficits were reported, and normal lower extremity motor function was  noted,   the above injectate was administered so that equal amounts of the injectate were placed at each foramen (level) into the transforaminal epidural space.   Additional Comments:  The patient tolerated the procedure well Dressing: Band-Aid    Post-procedure details: Patient was observed during the procedure. Post-procedure instructions were reviewed.  Patient left the clinic in stable condition.    Clinical History: MRI LUMBAR SPINE WITHOUT CONTRAST  TECHNIQUE: Multiplanar, multisequence MR imaging of the lumbar spine was performed. No intravenous contrast was administered.  COMPARISON:  Lumbar radiographs 10/18/2017.  FINDINGS: Segmentation:  Normal on the comparison.  Alignment:  Normal lumbar lordosis.  No spondylolisthesis.  Vertebrae: There is low level marrow edema in the bilateral L4-L5 posterior elements (series 8, image 11 on the left and image 4 on the right). On the right side the L5 pedicle is affected. This appears to be degenerative in nature, see additional details below. Background bone marrow signal is normal. No other acute osseous abnormality identified. Intact visible sacrum and SI joints.  Conus medullaris and cauda equina: Conus extends to the L1 level. No lower spinal cord or conus signal abnormality.  Paraspinal and other soft tissues: Negative.  Disc levels:  T11-T12: Negative.  T12-L1:  Negative.  L1-L2:  Negative.  L2-L3:  Negative disc.  Borderline to mild facet hypertrophy.  L3-L4: Negative disc. Mild facet and ligament flavum hypertrophy. No significant stenosis.  L4-L5: Negative disc. Moderate to severe bilateral facet and ligament flavum hypertrophy. Subsequent mild mass effect on the posterior thecal sac but no spinal stenosis. However, there is mild asymmetric stenosis at the right lateral recess (right L5 nerve level). Trace degenerative facet joint fluid on the right. There are small bilateral  posteriorly situated synovial cysts which should not cause neural compromise.  L5-S1: Negative disc. Mild to moderate bilateral facet hypertrophy. No stenosis.  IMPRESSION: 1. The dominant finding is advanced bilateral facet arthropathy at L4-L5 with mild associated degenerative appearing marrow edema. Questionable associated stenosis at the right lateral recess of L4-L5, query right L5 radiculitis. 2. Normal underlying lumbar discs. Mild to moderate lower lumbar facet hypertrophy elsewhere.   Electronically Signed   By: Odessa Fleming M.D.   On: 11/01/2017 11:38     Objective:  VS:  HT:    WT:   BMI:     BP:(!) 144/98  HR:98bpm  TEMP:98.6 F (37 C)(Oral)  RESP:  Physical Exam  Ortho Exam Imaging: No results found.

## 2017-12-14 NOTE — Procedures (Signed)
Lumbosacral Transforaminal Epidural Steroid Injection - Sub-Pedicular Approach with Fluoroscopic Guidance  Patient: Samantha Duarte      Date of Birth: 25-May-1970 MRN: [id number] PCP: Helane Rima, DO      Visit Date: 12/08/2017   Universal Protocol:    Date/Time: 12/08/2017  Consent Given By: the patient  Position: PRONE  Additional Comments: Vital signs were monitored before and after the procedure. Patient was prepped and draped in the usual sterile fashion. The correct patient, procedure, and site was verified.   Injection Procedure Details:  Procedure Site One Meds Administered:  Meds ordered this encounter  Medications  . betamethasone acetate-betamethasone sodium phosphate (CELESTONE) injection 12 mg    Laterality: Right  Location/Site:  L5-S1  Needle size: 22 G  Needle type: Spinal  Needle Placement: Transforaminal  Findings:    -Comments: Excellent flow of contrast along the nerve and into the epidural space.  Procedure Details: After squaring off the end-plates to get a true AP view, the C-arm was positioned so that an oblique view of the foramen as noted above was visualized. The target area is just inferior to the "nose of the scotty dog" or sub pedicular. The soft tissues overlying this structure were infiltrated with 2-3 ml. of 1% Lidocaine without Epinephrine.  The spinal needle was inserted toward the target using a "trajectory" view along the fluoroscope beam.  Under AP and lateral visualization, the needle was advanced so it did not puncture dura and was located close the 6 O'Clock position of the pedical in AP tracterory. Biplanar projections were used to confirm position. Aspiration was confirmed to be negative for CSF and/or blood. A 1-2 ml. volume of Isovue-250 was injected and flow of contrast was noted at each level. Radiographs were obtained for documentation purposes.   After attaining the desired flow of contrast documented above, a 0.5 to  1.0 ml test dose of 0.25% Marcaine was injected into each respective transforaminal space.  The patient was observed for 90 seconds post injection.  After no sensory deficits were reported, and normal lower extremity motor function was noted,   the above injectate was administered so that equal amounts of the injectate were placed at each foramen (level) into the transforaminal epidural space.   Additional Comments:  The patient tolerated the procedure well Dressing: Band-Aid    Post-procedure details: Patient was observed during the procedure. Post-procedure instructions were reviewed.  Patient left the clinic in stable condition.

## 2017-12-22 ENCOUNTER — Other Ambulatory Visit: Payer: Self-pay | Admitting: Family Medicine

## 2017-12-22 DIAGNOSIS — F419 Anxiety disorder, unspecified: Secondary | ICD-10-CM

## 2017-12-25 ENCOUNTER — Ambulatory Visit (INDEPENDENT_AMBULATORY_CARE_PROVIDER_SITE_OTHER): Payer: 59 | Admitting: Physical Medicine and Rehabilitation

## 2017-12-25 ENCOUNTER — Ambulatory Visit (INDEPENDENT_AMBULATORY_CARE_PROVIDER_SITE_OTHER): Payer: Self-pay

## 2017-12-25 ENCOUNTER — Encounter (INDEPENDENT_AMBULATORY_CARE_PROVIDER_SITE_OTHER): Payer: Self-pay | Admitting: Physical Medicine and Rehabilitation

## 2017-12-25 VITALS — BP 129/93 | HR 102 | Temp 98.8°F

## 2017-12-25 DIAGNOSIS — M5416 Radiculopathy, lumbar region: Secondary | ICD-10-CM | POA: Diagnosis not present

## 2017-12-25 MED ORDER — BETAMETHASONE SOD PHOS & ACET 6 (3-3) MG/ML IJ SUSP
12.0000 mg | Freq: Once | INTRAMUSCULAR | Status: AC
  Administered 2017-12-25: 12 mg

## 2017-12-25 NOTE — Patient Instructions (Addendum)
[facility] Discharge Instructions  *At any time if you have questions or concerns they can be answered by calling [phone]  All Patients: . You may experience an increase in your symptoms for the first 2 days (it can take 2 days to 2 weeks for the steroid/cortisone to have its maximal effect). . You may use ice to the site for the first 24 hours; 20 minutes on and 20 minutes off and may use heat after that time. . You may resume and continue your current pain medications. If you need a refill please contact the prescribing physician. . You may resume her regular medications if any were stopped for the procedure. . You may shower but no swimming, tub bath or Jacuzzi for 24 hours. . Please remove bandage after 4 hours. . You may resume light activities as tolerated. . If you had Spine Injection, you should not drive for the next 3 hours due to anesthetics used in the procedure. Please have someone drive for you.  *If you have had sedation, Valium, Xanax, or lorazepam: Do not drive or use public transportation for 24 hours, do not operating hazardous machinery or make important personal/business decisions for 24 hours.  POSSIBLE STEROID SIDE EFFECTS: If experienced these should only last for a short period. Change in menstrual flow  Edema in (swelling)  Increased appetite Skin flushing (redness)  Skin rash/acne  Thrush (oral) Vaginitis    Increased sweating  Depression Increased blood glucose levels Cramping and leg/calf  Euphoria (feeling happy)  POSSIBLE PROCEDURE SIDE EFFECTS: Please call our office if concerned. Increased pain Increased numbness/tingling  Headache Nausea/vomiting Hematoma (bruising/bleeding) Edema (swelling at the site) Weakness  Infection (red/drainage at site) Fever greater than 100.21F  *In the event of a headache after epidural steroid injection: Drink plenty of fluids, especially water and try to lay flat when possible. If the headache does  not get better after a few days or as always if concerned please call the office.  Dr. Lu Duffel McGill  "The Big Three" that will safely increase your endurance and protect your back: modified curl-up, side bridge, and bird dog.  1. Modified Curl-Up Lie your back with one knee bent and one knee straight, this puts your pelvis in a neutral position and the muscles of your core in an optimal alignment of pull to avoid strain on the low back. Place your hands under the arch of your low back and ensure that this arch is maintained throughout the curl-up. Start by bracing your abdomen; this is different from flexing your abs, bear down through your belly. Now make sure you can take a breath in and a breath out while maintaining this brace. If you cannot, stop there and practice doing just that until you've got it mastered! Now, pretend that your spine in your neck and your upper back are cemented together and do not move independently. Pick a spot on the ceiling and focus your gaze there, lift your shoulder blades about 30 off the floor and slowly return to the start position. Take note of your neck, and ensure that your chin isn't poking forward when you do a curl up. If you're struggling with that, focus on making a double chin. Perform 3 sets of 10-12.  2. Side Bridge Lie on your side and prop yourself up on your elbow. Ensure that your elbow is directly under your shoulder to avoid any unnecessary strain through your shoulder joint. With your legs straight, place your top foot on  the ground in front of your bottom foot. Place your top hand on your bottom shoulder. While maintaining the natural curve of your spine, that is to say, be sure that your upper body isn't twisted or leaning forward, brace your abdomen, squeeze through your gluteals (clench your bum), and lift your hips up off the ground. Don't forget to breathe! Hold for 8-10 seconds, repeat 3 times. As the exercise becomes easier, increase the number  of repetitions as opposed to the length of time. There are a number of ways to modify this exercise in order to increase or decrease the difficulty such as the example below on the right. If it's not challenging enough, try putting that top hand on your top hip, or straight up in the air, but again, be sure your body stays straight!  3. Bird Dog Start on your hands and knees with your hands shoulder width apart directly under your shoulders, and knees hip width apart directly under your hips. Maintain a neutral spine. Brace through your abdomen and squeeze your gluteals. Ensure you can maintain this while you take a breath in and out. Lift your right arm in front until it's level with your shoulder, squeezing the muscles between your shoulder blades as you do so. At the same time, extend your left leg straight back until it is level with your hips, squeezing your gluteals, and keeping your hips square to the floor. Return to the starting position in a slow and controlled manner, and perform the same action with the left arm and right leg. That is one repetition. Perform 3 sets of 8-10 repetitions. As this exercise becomes easy, focus on co-contracting the muscles of your forearm and arms while you extend, the same goes for the muscles of your legs. For an additional challenge, instead of putting your hand and knee back down on the ground between reps, try just sweeping the floor and performing the next rep right away, or draw a square with your arm and leg and then sweep the floor.  *Avoid exercises that forward flex the spine or extend the spine.

## 2017-12-25 NOTE — Progress Notes (Signed)
Numeric Pain Rating Scale and Functional Assessment Average Pain 4   In the last MONTH (on 0-10 scale) has pain interfered with the following?  1. General activity like being  able to carry out your everyday physical activities such as walking, climbing stairs, carrying groceries, or moving a chair?  Rating(5)    +Driver, -BT, -Dye Allergies.  

## 2017-12-29 ENCOUNTER — Encounter (HOSPITAL_COMMUNITY): Payer: Self-pay | Admitting: Emergency Medicine

## 2017-12-29 ENCOUNTER — Other Ambulatory Visit: Payer: Self-pay

## 2017-12-29 ENCOUNTER — Emergency Department (HOSPITAL_COMMUNITY): Payer: 59

## 2017-12-29 ENCOUNTER — Inpatient Hospital Stay (HOSPITAL_COMMUNITY)
Admission: EM | Admit: 2017-12-29 | Discharge: 2017-12-31 | DRG: 392 | Disposition: A | Discharge status: Home / Self Care | Payer: 59 | Attending: Internal Medicine | Admitting: Internal Medicine

## 2017-12-29 DIAGNOSIS — Z885 Allergy status to narcotic agent status: Secondary | ICD-10-CM | POA: Diagnosis not present

## 2017-12-29 DIAGNOSIS — F329 Major depressive disorder, single episode, unspecified: Secondary | ICD-10-CM | POA: Diagnosis present

## 2017-12-29 DIAGNOSIS — R112 Nausea with vomiting, unspecified: Secondary | ICD-10-CM | POA: Diagnosis not present

## 2017-12-29 DIAGNOSIS — R1084 Generalized abdominal pain: Secondary | ICD-10-CM | POA: Diagnosis not present

## 2017-12-29 DIAGNOSIS — M5416 Radiculopathy, lumbar region: Secondary | ICD-10-CM | POA: Diagnosis not present

## 2017-12-29 DIAGNOSIS — Z9884 Bariatric surgery status: Secondary | ICD-10-CM

## 2017-12-29 DIAGNOSIS — Z79899 Other long term (current) drug therapy: Secondary | ICD-10-CM

## 2017-12-29 DIAGNOSIS — K209 Esophagitis, unspecified without bleeding: Secondary | ICD-10-CM

## 2017-12-29 DIAGNOSIS — E876 Hypokalemia: Secondary | ICD-10-CM | POA: Diagnosis not present

## 2017-12-29 DIAGNOSIS — K449 Diaphragmatic hernia without obstruction or gangrene: Secondary | ICD-10-CM | POA: Diagnosis not present

## 2017-12-29 DIAGNOSIS — R1310 Dysphagia, unspecified: Secondary | ICD-10-CM

## 2017-12-29 DIAGNOSIS — R1011 Right upper quadrant pain: Secondary | ICD-10-CM | POA: Diagnosis not present

## 2017-12-29 DIAGNOSIS — R1114 Bilious vomiting: Secondary | ICD-10-CM

## 2017-12-29 DIAGNOSIS — K210 Gastro-esophageal reflux disease with esophagitis: Secondary | ICD-10-CM | POA: Diagnosis present

## 2017-12-29 DIAGNOSIS — K2900 Acute gastritis without bleeding: Secondary | ICD-10-CM | POA: Diagnosis not present

## 2017-12-29 DIAGNOSIS — F419 Anxiety disorder, unspecified: Secondary | ICD-10-CM | POA: Diagnosis present

## 2017-12-29 DIAGNOSIS — F17210 Nicotine dependence, cigarettes, uncomplicated: Secondary | ICD-10-CM | POA: Diagnosis present

## 2017-12-29 DIAGNOSIS — R1013 Epigastric pain: Secondary | ICD-10-CM | POA: Diagnosis not present

## 2017-12-29 LAB — COMPREHENSIVE METABOLIC PANEL
ALT: 18 U/L (ref 0–44)
AST: 27 U/L (ref 15–41)
Albumin: 3.9 g/dL (ref 3.5–5.0)
Alkaline Phosphatase: 60 U/L (ref 38–126)
Anion gap: 9 (ref 5–15)
BILIRUBIN TOTAL: 0.9 mg/dL (ref 0.3–1.2)
BUN: 12 mg/dL (ref 6–20)
CO2: 27 mmol/L (ref 22–32)
CREATININE: 0.86 mg/dL (ref 0.44–1.00)
Calcium: 9.2 mg/dL (ref 8.9–10.3)
Chloride: 104 mmol/L (ref 98–111)
Glucose, Bld: 102 mg/dL — ABNORMAL HIGH (ref 70–99)
Potassium: 3.2 mmol/L — ABNORMAL LOW (ref 3.5–5.1)
Sodium: 140 mmol/L (ref 135–145)
TOTAL PROTEIN: 6.7 g/dL (ref 6.5–8.1)

## 2017-12-29 LAB — CBC WITH DIFFERENTIAL/PLATELET
Abs Immature Granulocytes: 0.02 10*3/uL (ref 0.00–0.07)
BASOS PCT: 1 %
Basophils Absolute: 0 10*3/uL (ref 0.0–0.1)
EOS ABS: 0.1 10*3/uL (ref 0.0–0.5)
Eosinophils Relative: 1 %
HEMATOCRIT: 38.7 % (ref 36.0–46.0)
Hemoglobin: 12.9 g/dL (ref 12.0–15.0)
Immature Granulocytes: 0 %
LYMPHS ABS: 2.3 10*3/uL (ref 0.7–4.0)
Lymphocytes Relative: 42 %
MCH: 33.9 pg (ref 26.0–34.0)
MCHC: 33.3 g/dL (ref 30.0–36.0)
MCV: 101.8 fL — AB (ref 80.0–100.0)
MONO ABS: 0.5 10*3/uL (ref 0.1–1.0)
MONOS PCT: 10 %
Neutro Abs: 2.6 10*3/uL (ref 1.7–7.7)
Neutrophils Relative %: 46 %
Platelets: 214 10*3/uL (ref 150–400)
RBC: 3.8 MIL/uL — ABNORMAL LOW (ref 3.87–5.11)
RDW: 14.1 % (ref 11.5–15.5)
WBC: 5.5 10*3/uL (ref 4.0–10.5)
nRBC: 0 % (ref 0.0–0.2)

## 2017-12-29 LAB — BASIC METABOLIC PANEL
Anion gap: 8 (ref 5–15)
BUN: 9 mg/dL (ref 6–20)
CO2: 27 mmol/L (ref 22–32)
CREATININE: 0.73 mg/dL (ref 0.44–1.00)
Calcium: 8.9 mg/dL (ref 8.9–10.3)
Chloride: 106 mmol/L (ref 98–111)
GFR calc Af Amer: >60 mL/min (ref >60)
Glucose, Bld: 102 mg/dL — ABNORMAL HIGH (ref 70–99)
Potassium: 3.3 mmol/L — ABNORMAL LOW (ref 3.5–5.1)
SODIUM: 141 mmol/L (ref 135–145)

## 2017-12-29 LAB — GLUCOSE, CAPILLARY: Glucose-Capillary: 91 mg/dL (ref 70–99)

## 2017-12-29 LAB — LIPASE, BLOOD: LIPASE: 32 U/L (ref 11–51)

## 2017-12-29 MED ORDER — ONDANSETRON HCL 4 MG/2ML IJ SOLN
4.0000 mg | Freq: Four times a day (QID) | INTRAMUSCULAR | Status: DC | PRN
  Administered 2017-12-29 – 2017-12-30 (×2): 4 mg via INTRAVENOUS
  Filled 2017-12-29 (×2): qty 2

## 2017-12-29 MED ORDER — METOCLOPRAMIDE HCL 5 MG/ML IJ SOLN
5.0000 mg | Freq: Once | INTRAMUSCULAR | Status: AC
  Administered 2017-12-29: 5 mg via INTRAVENOUS
  Filled 2017-12-29: qty 2

## 2017-12-29 MED ORDER — FAMOTIDINE IN NACL 20-0.9 MG/50ML-% IV SOLN
20.0000 mg | Freq: Two times a day (BID) | INTRAVENOUS | Status: DC
  Administered 2017-12-29 – 2017-12-30 (×3): 20 mg via INTRAVENOUS
  Filled 2017-12-29 (×4): qty 50

## 2017-12-29 MED ORDER — PANTOPRAZOLE SODIUM 40 MG IV SOLR
40.0000 mg | Freq: Two times a day (BID) | INTRAVENOUS | Status: DC
  Administered 2017-12-29 – 2017-12-30 (×3): 40 mg via INTRAVENOUS
  Filled 2017-12-29 (×4): qty 40

## 2017-12-29 MED ORDER — SODIUM CHLORIDE 0.9 % IV BOLUS
1000.0000 mL | Freq: Once | INTRAVENOUS | Status: AC
  Administered 2017-12-29: 1000 mL via INTRAVENOUS

## 2017-12-29 MED ORDER — IOPAMIDOL (ISOVUE-300) INJECTION 61%
INTRAVENOUS | Status: AC
  Filled 2017-12-29: qty 100

## 2017-12-29 MED ORDER — DEXAMETHASONE SODIUM PHOSPHATE 10 MG/ML IJ SOLN
4.0000 mg | Freq: Two times a day (BID) | INTRAMUSCULAR | Status: DC
  Administered 2017-12-29 – 2017-12-30 (×3): 4 mg via INTRAVENOUS
  Filled 2017-12-29 (×4): qty 0.4

## 2017-12-29 MED ORDER — POTASSIUM CHLORIDE 10 MEQ/100ML IV SOLN
10.0000 meq | INTRAVENOUS | Status: AC
  Administered 2017-12-29 (×2): 10 meq via INTRAVENOUS
  Filled 2017-12-29 (×2): qty 100

## 2017-12-29 MED ORDER — ENOXAPARIN SODIUM 40 MG/0.4ML ~~LOC~~ SOLN
40.0000 mg | SUBCUTANEOUS | Status: DC
  Administered 2017-12-29: 40 mg via SUBCUTANEOUS
  Filled 2017-12-29: qty 0.4

## 2017-12-29 MED ORDER — SODIUM CHLORIDE 0.9 % IV SOLN
INTRAVENOUS | Status: DC
Start: 2017-12-29 — End: 2017-12-30
  Administered 2017-12-29: 11:00:00 via INTRAVENOUS

## 2017-12-29 MED ORDER — IOPAMIDOL (ISOVUE-300) INJECTION 61%
100.0000 mL | Freq: Once | INTRAVENOUS | Status: AC | PRN
  Administered 2017-12-29: 100 mL via INTRAVENOUS

## 2017-12-29 MED ORDER — DIPHENHYDRAMINE HCL 50 MG/ML IJ SOLN
25.0000 mg | Freq: Once | INTRAMUSCULAR | Status: AC
  Administered 2017-12-29: 25 mg via INTRAVENOUS
  Filled 2017-12-29: qty 1

## 2017-12-29 MED ORDER — LACTATED RINGERS IV BOLUS
1000.0000 mL | Freq: Once | INTRAVENOUS | Status: AC
  Administered 2017-12-30: 1000 mL via INTRAVENOUS

## 2017-12-29 MED ORDER — ONDANSETRON HCL 4 MG/2ML IJ SOLN: 4.0000 mg | Freq: Three times a day (TID) | INTRAMUSCULAR | Status: DC

## 2017-12-29 MED ORDER — SUCRALFATE 1 GM/10ML PO SUSP
1.0000 g | Freq: Three times a day (TID) | ORAL | Status: DC
  Administered 2017-12-29 (×3): 1 g via ORAL
  Filled 2017-12-29: qty 10

## 2017-12-29 MED ORDER — DIPHENHYDRAMINE HCL 50 MG/ML IJ SOLN
12.5000 mg | Freq: Once | INTRAMUSCULAR | Status: AC
  Administered 2017-12-29: 12.5 mg via INTRAVENOUS
  Filled 2017-12-29: qty 1

## 2017-12-29 NOTE — ED Notes (Signed)
Patient has tolerated ginger ale. A clear liquid tray ordered for the patient.

## 2017-12-29 NOTE — Consult Note (Addendum)
[facility] Surgery Consult Note  Samantha Duarte [birth year]  [id number].    Requesting MD: Lacretia Leigh Chief Complaint/Reason for Consult: abdominal pain  HPI:  Samantha Duarte is a 47yo female who presented to [facility] today with 1 day of abdominal pain, nausea, and vomiting. Patient has a history of lap band placement ~2008 in [place] by Dr. Donato Schultz; she has not established follow up with bariatric surgeon since moving to [place] [facility]. States that the band previously had fluid removed, but unsure how much fluid is still in the band. Currently unable to keep down solid food or liquids. Last BM was yesterday. Her pain is epigastric/near her lap band.  CT scan shows a mild hiatal hernia, lap band appears to be in good position, no acute abnormalities. WBC 5.5, VSS. General surgery asked to see.   ROS: Review of Systems  Constitutional: Negative.   HENT: Negative.   Eyes: Negative.   Respiratory: Negative.   Cardiovascular: Negative.   Gastrointestinal: Positive for abdominal pain, nausea and vomiting.  Genitourinary: Negative.   Musculoskeletal: Negative.   Skin: Negative.   Neurological: Negative.    All systems reviewed and otherwise negative except for as above  No family history on file.  Past Medical History:  Diagnosis Date  . Anxiety   . Depression   . 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 Tobacco use 09/29/2016    Past Surgical History:  Procedure Laterality Date  . CHOLECYSTECTOMY    . LAPAROSCOPIC ROUX-EN-Y GASTRIC BYPASS WITH UPPER ENDOSCOPY AND REMOVAL OF LAP BAND      Social History:  reports that she has been smoking cigarettes. She has been smoking about 0.50 packs per day. She has never used smokeless tobacco. She reports that she drinks about 14.0 standard drinks of alcohol per week. She reports that she does not use drugs.  Allergies:  Allergies  Allergen Reactions  . Morphine And Related Nausea Only     (Not in a  hospital admission)  Prior to Admission medications   Medication Sig Start Date End Date Taking? Authorizing Provider  busPIRone (BUSPAR) 10 MG tablet TAKE 1 TABLET (10 MG TOTAL) BY MOUTH 3 (THREE) TIMES DAILY. Patient taking differently: Take 10 mg by mouth daily.  12/22/17  Yes Briscoe Deutscher, DO  cyclobenzaprine (FLEXERIL) 5 MG tablet TAKE 1 TABLET (5 MG TOTAL) BY MOUTH 3 (THREE) TIMES DAILY AS NEEDED FOR MUSCLE SPASMS. 11/24/17  Yes Briscoe Deutscher, DO  escitalopram (LEXAPRO) 20 MG tablet TAKE 1 TABLET (20 MG TOTAL) BY MOUTH DAILY. 07/06/17  Yes Briscoe Deutscher, DO  famotidine (PEPCID) 20 MG tablet Take 20 mg by mouth 2 (two) times daily.   Yes [provider]  gabapentin (NEURONTIN) 300 MG capsule Start with 1 tab po qhs X 1 week, then increase to 1 tab po bid X 1 week then 1 tab po tid prn Patient taking differently: Take 300 mg by mouth at bedtime.  11/23/17  Yes Gerda Diss, DO  hydrOXYzine (ATARAX/VISTARIL) 25 MG tablet Take 1 tablet (25 mg total) by mouth every 6 (six) hours. Patient not taking: Reported on 12/29/2017 09/29/16   Briscoe Deutscher, DO  ketorolac (TORADOL) 10 MG tablet Take 1 tablet (10 mg total) by mouth every 6 (six) hours as needed. Patient not taking: Reported on 12/29/2017 10/09/17   Briscoe Deutscher, DO  traZODone (DESYREL) 50 MG tablet Take 0.5-1 tablets (25-50 mg total) by mouth at bedtime as needed for sleep. Patient not taking: Reported on 12/29/2017 10/09/17  Briscoe Deutscher, DO    Blood pressure (!) 156/110, pulse 72, temperature 99.1 F (37.3 C), resp. rate 17, SpO2 100 %. Physical Exam: General: pleasant, WD/WN AA female who is laying in bed in NAD HEENT: head is normocephalic, atraumatic.  Sclera are noninjected.  Pupils equal and round.  Ears and nose without any masses or lesions.  Mouth is pink and moist. Dentition fair Lungs: respiratory effort nonlabored, no audible wheezing Abd: soft, ND, +BS, no masses, hernias, or organomegaly. Palpable lap band  central abdomen that is tender without rebound or guarding >> huber needle inserted into band and found no fluid to be remaining in the band MS: all 4 extremities are symmetrical with no cyanosis, clubbing, or edema. Skin: warm and dry with no masses, lesions, or rashes Psych: A&Ox3 with an appropriate affect. Neuro: cranial nerves grossly intact, normal speech  Results for orders placed or performed during the hospital encounter of 12/29/17 (from the past 48 hour(s))  CBC with Differential/Platelet     Status: Abnormal   Collection Time: 12/29/17  8:53 AM  Result Value Ref Range   WBC 5.5 4.0 - 10.5 K/uL   RBC 3.80 (L) 3.87 - 5.11 MIL/uL   Hemoglobin 12.9 12.0 - 15.0 g/dL   HCT 38.7 36.0 - 46.0 %   MCV 101.8 (H) 80.0 - 100.0 fL   MCH 33.9 26.0 - 34.0 pg   MCHC 33.3 30.0 - 36.0 g/dL   RDW 14.1 11.5 - 15.5 %   Platelets 214 150 - 400 K/uL   nRBC 0.0 0.0 - 0.2 %   Neutrophils Relative % 46 %   Neutro Abs 2.6 1.7 - 7.7 K/uL   Lymphocytes Relative 42 %   Lymphs Abs 2.3 0.7 - 4.0 K/uL   Monocytes Relative 10 %   Monocytes Absolute 0.5 0.1 - 1.0 K/uL   Eosinophils Relative 1 %   Eosinophils Absolute 0.1 0.0 - 0.5 K/uL   Basophils Relative 1 %   Basophils Absolute 0.0 0.0 - 0.1 K/uL   Immature Granulocytes 0 %   Abs Immature Granulocytes 0.02 0.00 - 0.07 K/uL    Comment: Performed at [facility], [place] [address] [place], [place] [address]  Comprehensive metabolic panel     Status: Abnormal   Collection Time: 12/29/17  8:53 AM  Result Value Ref Range   Sodium 140 135 - 145 mmol/L   Potassium 3.2 (L) 3.5 - 5.1 mmol/L   Chloride 104 98 - 111 mmol/L   CO2 27 22 - 32 mmol/L   Glucose, Bld 102 (H) 70 - 99 mg/dL   BUN 12 6 - 20 mg/dL   Creatinine, Ser 0.86 0.44 - 1.00 mg/dL   Calcium 9.2 8.9 - 10.3 mg/dL   Total Protein 6.7 6.5 - 8.1 g/dL   Albumin 3.9 3.5 - 5.0 g/dL   AST 27 15 - 41 U/L   ALT 18 0 - 44 U/L   Alkaline Phosphatase 60 38 - 126 U/L   Total  Bilirubin 0.9 0.3 - 1.2 mg/dL   GFR calc non Af Amer >60 >60 mL/min   GFR calc Af Amer >60 >60 mL/min    Comment: (NOTE) The eGFR has been calculated using the CKD EPI equation. This calculation has not been validated in all clinical situations. eGFR's persistently <60 mL/min signify possible Chronic Kidney Disease.    Anion gap 9 5 - 15    Comment: Performed at [facility], [address] [place],  [place] [address]  Lipase, blood     Status: None   Collection Time: 12/29/17  8:53 AM  Result Value Ref Range   Lipase 32 11 - 51 U/L    Comment: Performed at [facility], [place] [address], [place], [place] [address]   Ct Abdomen Pelvis W Contrast  Result Date: 12/29/2017 CLINICAL DATA:  Acute generalized abdominal pain. EXAM: CT ABDOMEN AND PELVIS WITH CONTRAST TECHNIQUE: Multidetector CT imaging of the abdomen and pelvis was performed using the standard protocol following bolus administration of intravenous contrast. CONTRAST:  159m ISOVUE-300 IOPAMIDOL (ISOVUE-300) INJECTION 61% COMPARISON:  None. FINDINGS: Lower chest: Mild sliding-type hiatal hernia is noted. Visualized lung bases are unremarkable. Hepatobiliary: No focal liver abnormality is seen. Status post cholecystectomy. No biliary dilatation. Pancreas: Unremarkable. No pancreatic ductal dilatation or surrounding inflammatory changes. Spleen: Normal in size without focal abnormality. Adrenals/Urinary Tract: Adrenal glands are unremarkable. Kidneys are normal, without renal calculi, focal lesion, or hydronephrosis. Bladder is unremarkable. Stomach/Bowel: Status post lap band procedure. This appears to be in grossly good position. There is no evidence of bowel obstruction or inflammation. The appendix appears normal. Vascular/Lymphatic: No significant vascular findings are present. No enlarged abdominal or pelvic lymph nodes. Reproductive: Uterus and bilateral adnexa are unremarkable. Other: No  abdominal wall hernia or abnormality. No abdominopelvic ascites. Musculoskeletal: No acute or significant osseous findings. IMPRESSION: Status post lap band procedure. This appears to be in grossly good position. Mild sliding-type hiatal hernia. No other abnormality seen in the abdomen or pelvis. Electronically Signed   By: JMarijo Conception M.D.   On: 12/29/2017 11:39      Assessment/Plan Abdominal pain, nausea, vomiting [place] Patient seen and examined with Dr. WDema Severin Lap band accessed and found to be void of fluid. Will give carafate and trial clear liquids. If patient is able to tolerate liquids she may go home. Recommend liquid diet for 1 week. If she is unable to tolerate liquids she will need to be admitted to IVF rehydration and monitoring.  I will contact our office and have someone reach out to her next week to obtain records and see if she can be set up with an appointment with one of our bariatric surgeons in the office.  Addendum: Patient did not tolerate liquids. Will have patient admitted to medicine for for IVF rehydration. Will order and UGI. May need GI consult and upper endoscopy. Will continue to follow.   BWellington Hampshire [facility] 12/29/2017, 2:37 PM Pager: [phone] 7:00 am -11:30 AM Tues-Fri 7:00 am-4:30 pm Sat-Sun 7:00 am-11:30 am

## 2017-12-29 NOTE — ED Provider Notes (Addendum)
[facility]-EMERGENCY DEPT Provider Note   CSN: [id number] Arrival date & time: 12/29/17  0827     History   Chief Complaint Chief Complaint  Patient presents with  . Abdominal Pain  . Emesis    HPI Samantha Duarte is a 47 y.o. female.  47 year old female with history of gastric bypass with LAP-BAND placement presents with 24 hours of abdominal discomfort and nonbilious or bloody emesis.  Patient has had her band deflated but has not had follow-up since.  Denies any fever or chills.  No diarrhea.  Pain starts in epigastric goes right upper quadrant.  She is also status post cholecystectomy.  Symptoms persistent and nothing makes them better worse no treatment used prior to arrival.     Past Medical History:  Diagnosis Date  . Anxiety   . Depression   . 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 Tobacco use 09/29/2016    Patient Active Problem List   Diagnosis Date Noted  . Foot drop, right 11/23/2017  . Lumbar back pain with radiculopathy affecting right lower extremity 11/23/2017  . Nicotine dependence 09/29/2016  . Anxiety 09/29/2016  . History of laparoscopic adjustable gastric banding 09/29/2016    Past Surgical History:  Procedure Laterality Date  . CHOLECYSTECTOMY    . LAPAROSCOPIC ROUX-EN-Y GASTRIC BYPASS WITH UPPER ENDOSCOPY AND REMOVAL OF LAP BAND       OB History   None      Home Medications    Prior to Admission medications   Medication Sig Start Date End Date Taking? Authorizing Provider  busPIRone (BUSPAR) 10 MG tablet TAKE 1 TABLET (10 MG TOTAL) BY MOUTH 3 (THREE) TIMES DAILY. 12/22/17   Helane Rima, DO  cyclobenzaprine (FLEXERIL) 5 MG tablet TAKE 1 TABLET (5 MG TOTAL) BY MOUTH 3 (THREE) TIMES DAILY AS NEEDED FOR MUSCLE SPASMS. 11/24/17   Helane Rima, DO  escitalopram (LEXAPRO) 20 MG tablet TAKE 1 TABLET (20 MG TOTAL) BY MOUTH DAILY. 07/06/17   Helane Rima, DO  gabapentin (NEURONTIN) 300 MG  capsule Start with 1 tab po qhs X 1 week, then increase to 1 tab po bid X 1 week then 1 tab po tid prn 11/23/17   Andrena Mews, DO  hydrOXYzine (ATARAX/VISTARIL) 25 MG tablet Take 1 tablet (25 mg total) by mouth every 6 (six) hours. 09/29/16   Helane Rima, DO  ketorolac (TORADOL) 10 MG tablet Take 1 tablet (10 mg total) by mouth every 6 (six) hours as needed. 10/09/17   Helane Rima, DO  ranitidine (ZANTAC) 150 MG capsule Take 150 mg by mouth 2 (two) times daily.    [provider]  traZODone (DESYREL) 50 MG tablet Take 0.5-1 tablets (25-50 mg total) by mouth at bedtime as needed for sleep. 10/09/17   Helane Rima, DO    Family History No family history on file.  Social History Social History   Tobacco Use  . Smoking status: Current Every Day Smoker    Packs/day: 0.50    Types: Cigarettes  . Smokeless tobacco: Never Used  Substance Use Topics  . Alcohol use: Yes    Alcohol/week: 14.0 standard drinks    Types: 14 Glasses of wine per week  . Drug use: No     Allergies   Morphine and related   Review of Systems Review of Systems  All other systems reviewed and are negative.    Physical Exam Updated Vital Signs BP (!) 156/107   Pulse 92   Temp  99.1 F (37.3 C)   Resp 17   SpO2 99%   Physical Exam  Constitutional: She is oriented to person, place, and time. She appears well-developed and well-nourished.  Non-toxic appearance. No distress.  HENT:  Head: Normocephalic and atraumatic.  Eyes: Pupils are equal, round, and reactive to light. Conjunctivae, EOM and lids are normal.  Neck: Normal range of motion. Neck supple. No tracheal deviation present. No thyroid mass present.  Cardiovascular: Normal rate, regular rhythm and normal heart sounds. Exam reveals no gallop.  No murmur heard. Pulmonary/Chest: Effort normal and breath sounds normal. No stridor. No respiratory distress. She has no decreased breath sounds. She has no wheezes. She has no rhonchi. She  has no rales.  Abdominal: Soft. Normal appearance and bowel sounds are normal. She exhibits no distension. There is tenderness in the right upper quadrant and epigastric area. There is no rigidity, no rebound, no guarding and no CVA tenderness.    Musculoskeletal: Normal range of motion. She exhibits no edema or tenderness.  Neurological: She is alert and oriented to person, place, and time. She has normal strength. No cranial nerve deficit or sensory deficit. GCS eye subscore is 4. GCS verbal subscore is 5. GCS motor subscore is 6.  Skin: Skin is warm and dry. No abrasion and no rash noted.  Psychiatric: She has a normal mood and affect. Her speech is normal and behavior is normal.  Nursing note and vitals reviewed.    ED Treatments / Results  Labs (all labs ordered are listed, but only abnormal results are displayed) Labs Reviewed  CBC WITH DIFFERENTIAL/PLATELET  COMPREHENSIVE METABOLIC PANEL  LIPASE, BLOOD    EKG None  Radiology No results found.  Procedures Procedures (including critical care time)  Medications Ordered in ED Medications  0.9 %  sodium chloride infusion (has no administration in time range)  sodium chloride 0.9 % bolus 1,000 mL (has no administration in time range)     Initial Impression / Assessment and Plan / ED Course  I have reviewed the triage vital signs and the nursing notes.  Pertinent labs & imaging results that were available during my care of the patient were reviewed by me and considered in my medical decision making (see chart for details).    Abdominal CT results noted.  Consult to general surgery. Patient seen by general surgery who will disposition the patient  3:52 PM Patient having emesis despite receiving meds here.  Discussed with Dr. Cliffton Asters from general surgery recommends medicine admission with likely EGD.  Final Clinical Impressions(s) / ED Diagnoses   Final diagnoses:  None    ED Discharge Orders    None         Lorre Nick, MD 12/29/17 1411    Lorre Nick, MD 12/29/17 [id number]

## 2017-12-29 NOTE — ED Notes (Signed)
Pt. Headed to CT. Nurse aware.

## 2017-12-29 NOTE — ED Notes (Signed)
Attempted to call floor nurse to give handoff report. Nurse did not pick up phone. Will attempt to retry handoff report in 10 mins.

## 2017-12-29 NOTE — ED Notes (Signed)
ED TO INPATIENT HANDOFF REPORT  Name/Age/Gender Samantha Duarte 47 y.o. female  Code Status    Code Status Orders  (From admission, onward)         Start     Ordered   12/29/17 1639  Full code  Continuous     12/29/17 1638        Code Status History    This patient has a current code status but no historical code status.      Home/SNF/Other Given to floor  Chief Complaint lap band issue  Level of Care/Admitting Diagnosis ED Disposition    ED Disposition Condition [facility]: [facility] [100102]  Level of Care: Med-Surg [16]  Diagnosis: Intractable nausea and vomiting [867619]  Admitting Physician: Georgette Shell [[id number]]  Attending Physician: Georgette Shell [[id number]]  PT Class (Do Not Modify): Observation [104]  PT Acc Code (Do Not Modify): Observation [10022]       Medical History Past Medical History:  Diagnosis Date  . Anxiety   . Depression   . 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 Tobacco use 09/29/2016    Allergies Allergies  Allergen Reactions  . Morphine And Related Nausea Only    IV Location/Drains/Wounds Patient Lines/Drains/Airways Status   Active Line/Drains/Airways    Name:   Placement date:   Placement time:   Site:   Days:   Peripheral IV 12/29/17 Left Forearm   12/29/17    0918    Forearm   less than 1          Labs/Imaging Results for orders placed or performed during the hospital encounter of 12/29/17 (from the past 48 hour(s))  CBC with Differential/Platelet     Status: Abnormal   Collection Time: 12/29/17  8:53 AM  Result Value Ref Range   WBC 5.5 4.0 - 10.5 K/uL   RBC 3.80 (L) 3.87 - 5.11 MIL/uL   Hemoglobin 12.9 12.0 - 15.0 g/dL   HCT 38.7 36.0 - 46.0 %   MCV 101.8 (H) 80.0 - 100.0 fL   MCH 33.9 26.0 - 34.0 pg   MCHC 33.3 30.0 - 36.0 g/dL   RDW 14.1 11.5 - 15.5 %   Platelets 214 150 - 400 K/uL   nRBC 0.0 0.0 - 0.2 %   Neutrophils  Relative % 46 %   Neutro Abs 2.6 1.7 - 7.7 K/uL   Lymphocytes Relative 42 %   Lymphs Abs 2.3 0.7 - 4.0 K/uL   Monocytes Relative 10 %   Monocytes Absolute 0.5 0.1 - 1.0 K/uL   Eosinophils Relative 1 %   Eosinophils Absolute 0.1 0.0 - 0.5 K/uL   Basophils Relative 1 %   Basophils Absolute 0.0 0.0 - 0.1 K/uL   Immature Granulocytes 0 %   Abs Immature Granulocytes 0.02 0.00 - 0.07 K/uL    Comment: Performed at [facility], [place] [address] [place], [place] [address]  Comprehensive metabolic panel     Status: Abnormal   Collection Time: 12/29/17  8:53 AM  Result Value Ref Range   Sodium 140 135 - 145 mmol/L   Potassium 3.2 (L) 3.5 - 5.1 mmol/L   Chloride 104 98 - 111 mmol/L   CO2 27 22 - 32 mmol/L   Glucose, Bld 102 (H) 70 - 99 mg/dL   BUN 12 6 - 20 mg/dL   Creatinine, Ser 0.86 0.44 - 1.00 mg/dL   Calcium 9.2 8.9 - 10.3 mg/dL  Total Protein 6.7 6.5 - 8.1 g/dL   Albumin 3.9 3.5 - 5.0 g/dL   AST 27 15 - 41 U/L   ALT 18 0 - 44 U/L   Alkaline Phosphatase 60 38 - 126 U/L   Total Bilirubin 0.9 0.3 - 1.2 mg/dL   GFR calc non Af Amer >60 >60 mL/min   GFR calc Af Amer >60 >60 mL/min    Comment: (NOTE) The eGFR has been calculated using the CKD EPI equation. This calculation has not been validated in all clinical situations. eGFR's persistently <60 mL/min signify possible Chronic Kidney Disease.    Anion gap 9 5 - 15    Comment: Performed at [facility], [place] [address], [place], [place] [address]  Lipase, blood     Status: None   Collection Time: 12/29/17  8:53 AM  Result Value Ref Range   Lipase 32 11 - 51 U/L    Comment: Performed at [facility], [place] [address] [place], [place] [address]   Ct Abdomen Pelvis W Contrast  Result Date: 12/29/2017 CLINICAL DATA:  Acute generalized abdominal pain. EXAM: CT ABDOMEN AND PELVIS WITH CONTRAST TECHNIQUE: Multidetector CT imaging of the abdomen and pelvis was performed using the  standard protocol following bolus administration of intravenous contrast. CONTRAST:  185m ISOVUE-300 IOPAMIDOL (ISOVUE-300) INJECTION 61% COMPARISON:  None. FINDINGS: Lower chest: Mild sliding-type hiatal hernia is noted. Visualized lung bases are unremarkable. Hepatobiliary: No focal liver abnormality is seen. Status post cholecystectomy. No biliary dilatation. Pancreas: Unremarkable. No pancreatic ductal dilatation or surrounding inflammatory changes. Spleen: Normal in size without focal abnormality. Adrenals/Urinary Tract: Adrenal glands are unremarkable. Kidneys are normal, without renal calculi, focal lesion, or hydronephrosis. Bladder is unremarkable. Stomach/Bowel: Status post lap band procedure. This appears to be in grossly good position. There is no evidence of bowel obstruction or inflammation. The appendix appears normal. Vascular/Lymphatic: No significant vascular findings are present. No enlarged abdominal or pelvic lymph nodes. Reproductive: Uterus and bilateral adnexa are unremarkable. Other: No abdominal wall hernia or abnormality. No abdominopelvic ascites. Musculoskeletal: No acute or significant osseous findings. IMPRESSION: Status post lap band procedure. This appears to be in grossly good position. Mild sliding-type hiatal hernia. No other abnormality seen in the abdomen or pelvis. Electronically Signed   By: JMarijo Conception M.D.   On: 12/29/2017 11:39    Pending Labs Unresulted Labs (From admission, onward)    Start     Ordered   12/29/17 1638  HIV antibody (Routine Testing)  Once,   R     12/29/17 1638          Vitals/Pain Today's Vitals   12/29/17 1520 12/29/17 1539 12/29/17 1541 12/29/17 1546  BP: (!) 153/105  (!) 156/109 (!) 169/74  Pulse: 71  85 (!) 103  Resp: '18  16 19  '$ Temp:      SpO2: 100%  98% 95%  PainSc:  7       Isolation Precautions No active isolations  Medications Medications  0.9 %  sodium chloride infusion ( Intravenous New Bag/Given 12/29/17  1041)  iopamidol (ISOVUE-300) 61 % injection (has no administration in time range)  sucralfate (CARAFATE) 1 GM/10ML suspension 1 g (has no administration in time range)  iopamidol (ISOVUE-300) 61 % injection (has no administration in time range)  ondansetron (ZOFRAN) injection 4 mg (has no administration in time range)  famotidine (PEPCID) IVPB 20 mg premix (has no administration in time range)  enoxaparin (LOVENOX) injection 40 mg (has  no administration in time range)  sodium chloride 0.9 % bolus 1,000 mL (0 mLs Intravenous Stopped 12/29/17 1020)  metoCLOPramide (REGLAN) injection 5 mg (5 mg Intravenous Given 12/29/17 0936)  diphenhydrAMINE (BENADRYL) injection 12.5 mg (12.5 mg Intravenous Given 12/29/17 0935)  iopamidol (ISOVUE-300) 61 % injection 100 mL (100 mLs Intravenous Contrast Given 12/29/17 1051)    Mobility walks

## 2017-12-29 NOTE — ED Notes (Signed)
Patient given clear liquids (ginger ale) as ordered.

## 2017-12-29 NOTE — ED Triage Notes (Signed)
Pt reports that she had lab band surgery in [place] 11 years ago and yesterday wore something tight and started having abd pains with n/v. Believes her band moved.

## 2017-12-29 NOTE — H&P (Signed)
History and Physical    Mayo Owczarzak UJW:[id number] DOB: [birth year] DOA: 12/29/2017  PCP: Helane Rima, DO  Patient coming from: Home    Chief Complaint: Nausea vomiting and upper abdominal pain  HPI: Samantha Duarte is a 47 y.o. female with medical history significant of laparoscopic adjustable gastric banding in 2008 in [place], cholecystectomy laparoscopic Roux-en-Y gastric bypass admitted with complaints of intractable nausea vomiting and abdominal pain.  Patient moved to [facility] a year ago has not established with a Teacher, English as a foreign language yet.  She reports her last bowel movement as yesterday which was brown in color and no flatus today.  She could not tolerate ginger ale while she was in the ER.  She denies any dysphagia or odynophagia.  She describes the epigastric pain as squeezing a wet towel.  Vomiting and burping makes the pain better.  She has history of GERD.  Patient was seen by general surgery in the ER.  They recommended upper GI and GI consult for possible EGD.  Patient denies any urinary complaints, fever chills chest pain shortness of breath.  Patient works with a [employer] system.  Patient reports decreased appetite and losing 40 pounds in the last year. ED Course: Vital signs remained stable she was unable to tolerate any p.o. intake.  Labs were significant for sodium 140 potassium 3.2 BUN 12 creatinine 0.86 hemoglobin 12.9 white count 5.5 normal platelet count.  CT scan of the abdomen and pelvis shows status post lap band procedure appears to be in grossly good position mild sliding-type hiatal hernia no other acute abnormality seen in the abdomen or pelvis.  Review of Systems: As per HPI otherwise 10 point review of systems negative.    Past Medical History:  Diagnosis Date  . Anxiety   . Depression   . 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 Tobacco use 09/29/2016    Past Surgical History:  Procedure Laterality Date  . CHOLECYSTECTOMY    .  LAPAROSCOPIC ROUX-EN-Y GASTRIC BYPASS WITH UPPER ENDOSCOPY AND REMOVAL OF LAP BAND       reports that she has been smoking cigarettes. She has been smoking about 0.50 packs per day. She has never used smokeless tobacco. She reports that she drinks about 14.0 standard drinks of alcohol per week. She reports that she does not use drugs.  Allergies  Allergen Reactions  . Morphine And Related Nausea Only    No family history on file.   Prior to Admission medications   Medication Sig Start Date End Date Taking? Authorizing Provider  busPIRone (BUSPAR) 10 MG tablet TAKE 1 TABLET (10 MG TOTAL) BY MOUTH 3 (THREE) TIMES DAILY. Patient taking differently: Take 10 mg by mouth daily.  12/22/17  Yes Helane Rima, DO  cyclobenzaprine (FLEXERIL) 5 MG tablet TAKE 1 TABLET (5 MG TOTAL) BY MOUTH 3 (THREE) TIMES DAILY AS NEEDED FOR MUSCLE SPASMS. 11/24/17  Yes Helane Rima, DO  escitalopram (LEXAPRO) 20 MG tablet TAKE 1 TABLET (20 MG TOTAL) BY MOUTH DAILY. 07/06/17  Yes Helane Rima, DO  famotidine (PEPCID) 20 MG tablet Take 20 mg by mouth 2 (two) times daily.   Yes [provider]  gabapentin (NEURONTIN) 300 MG capsule Start with 1 tab po qhs X 1 week, then increase to 1 tab po bid X 1 week then 1 tab po tid prn Patient taking differently: Take 300 mg by mouth at bedtime.  11/23/17  Yes Andrena Mews, DO  hydrOXYzine (ATARAX/VISTARIL) 25 MG tablet Take 1 tablet (25 mg  total) by mouth every 6 (six) hours. Patient not taking: Reported on 12/29/2017 09/29/16   Helane Rima, DO  ketorolac (TORADOL) 10 MG tablet Take 1 tablet (10 mg total) by mouth every 6 (six) hours as needed. Patient not taking: Reported on 12/29/2017 10/09/17   Helane Rima, DO  traZODone (DESYREL) 50 MG tablet Take 0.5-1 tablets (25-50 mg total) by mouth at bedtime as needed for sleep. Patient not taking: Reported on 12/29/2017 10/09/17   Helane Rima, DO    Physical Exam: Vitals:   12/29/17 1419 12/29/17 1520 12/29/17  1541 12/29/17 1546  BP: (!) 156/110 (!) 153/105 (!) 156/109 (!) 169/74  Pulse: 72 71 85 (!) 103  Resp: 17 18 16 19   Temp:      SpO2: 100% 100% 98% 95%    Constitutional: NAD, calm, comfortable Vitals:   12/29/17 1419 12/29/17 1520 12/29/17 1541 12/29/17 1546  BP: (!) 156/110 (!) 153/105 (!) 156/109 (!) 169/74  Pulse: 72 71 85 (!) 103  Resp: 17 18 16 19   Temp:      SpO2: 100% 100% 98% 95%   Eyes: PERRL, lids and conjunctivae normal ENMT: Mucous membranes are moist. Posterior pharynx clear of any exudate or lesions.Normal dentition.  Neck: normal, supple, no masses, no thyromegaly Respiratory: clear to auscultation bilaterally, no wheezing, no crackles. Normal respiratory effort. No accessory muscle use.  Cardiovascular: Regular rate and rhythm, no murmurs / rubs / gallops. No extremity edema. 2+ pedal pulses. No carotid bruits.  Abdomen: Epigastric tenderness, no masses palpated. No hepatosplenomegaly. Bowel sounds positive.  Musculoskeletal: no clubbing / cyanosis. No joint deformity upper and lower extremities. Good ROM, no contractures. Normal muscle tone.  Skin: no rashes, lesions, ulcers. No induration Neurologic: CN 2-12 grossly intact. Sensation intact, DTR normal. Strength 5/5 in all 4.  Psychiatric: Normal judgment and insight. Alert and oriented x 3. Normal mood.   Labs on Admission: I have personally reviewed following labs and imaging studies  CBC: Recent Labs  Lab 12/29/17 0853  WBC 5.5  NEUTROABS 2.6  HGB 12.9  HCT 38.7  MCV 101.8*  PLT 214   Basic Metabolic Panel: Recent Labs  Lab 12/29/17 0853  NA 140  K 3.2*  CL 104  CO2 27  GLUCOSE 102*  BUN 12  CREATININE 0.86  CALCIUM 9.2   GFR: CrCl cannot be calculated (Unknown ideal weight.). Liver Function Tests: Recent Labs  Lab 12/29/17 0853  AST 27  ALT 18  ALKPHOS 60  BILITOT 0.9  PROT 6.7  ALBUMIN 3.9   Recent Labs  Lab 12/29/17 0853  LIPASE 32   No results for input(s): AMMONIA in  the last 168 hours. Coagulation Profile: No results for input(s): INR, PROTIME in the last 168 hours. Cardiac Enzymes: No results for input(s): CKTOTAL, CKMB, CKMBINDEX, TROPONINI in the last 168 hours. BNP (last 3 results) No results for input(s): PROBNP in the last 8760 hours. HbA1C: No results for input(s): HGBA1C in the last 72 hours. CBG: No results for input(s): GLUCAP in the last 168 hours. Lipid Profile: No results for input(s): CHOL, HDL, LDLCALC, TRIG, CHOLHDL, LDLDIRECT in the last 72 hours. Thyroid Function Tests: No results for input(s): TSH, T4TOTAL, FREET4, T3FREE, THYROIDAB in the last 72 hours. Anemia Panel: No results for input(s): VITAMINB12, FOLATE, FERRITIN, TIBC, IRON, RETICCTPCT in the last 72 hours. Urine analysis: No results found for: COLORURINE, APPEARANCEUR, LABSPEC, PHURINE, GLUCOSEU, HGBUR, BILIRUBINUR, KETONESUR, PROTEINUR, UROBILINOGEN, NITRITE, LEUKOCYTESUR  Radiological Exams on Admission: Ct Abdomen Pelvis W  Contrast  Result Date: 12/29/2017 CLINICAL DATA:  Acute generalized abdominal pain. EXAM: CT ABDOMEN AND PELVIS WITH CONTRAST TECHNIQUE: Multidetector CT imaging of the abdomen and pelvis was performed using the standard protocol following bolus administration of intravenous contrast. CONTRAST:  ISOVUE-300 IOPAMIDOL (ISOVUE-300) INJECTION 61% COMPARISON:  None. FINDINGS: Lower chest: Mild sliding-type hiatal hernia is noted. Visualized lung bases are unremarkable. Hepatobiliary: No focal liver abnormality is seen. Status post cholecystectomy. No biliary dilatation. Pancreas: Unremarkable. No pancreatic ductal dilatation or surrounding inflammatory changes. Spleen: Normal in size without focal abnormality. Adrenals/Urinary Tract: Adrenal glands are unremarkable. Kidneys are normal, without renal calculi, focal lesion, or hydronephrosis. Bladder is unremarkable. Stomach/Bowel: Status post lap band procedure. This appears to be in grossly good  position. There is no evidence of bowel obstruction or inflammation. The appendix appears normal. Vascular/Lymphatic: No significant vascular findings are present. No enlarged abdominal or pelvic lymph nodes. Reproductive: Uterus and bilateral adnexa are unremarkable. Other: No abdominal wall hernia or abnormality. No abdominopelvic ascites. Musculoskeletal: No acute or significant osseous findings. IMPRESSION: Status post lap band procedure. This appears to be in grossly good position. Mild sliding-type hiatal hernia. No other abnormality seen in the abdomen or pelvis. Electronically Signed   By: Lupita Raider, M.D.   On: 12/29/2017 11:39      Assessment/Plan Active Problems:   Intractable nausea and vomiting #1 intractable nausea vomiting and abdominal pain with a history of gastric bypass and lap band placement.  Patient unable to tolerate clear liquids in the ER.  Will admit for IV hydration  Patient going down for upper GI at this time.  Discussed with Dr. Bosie Clos if patient's symptoms are not better reconsult him in the morning.  #2 history of anxiety and depression hold off on p.o. medications  #3 hypokalemia replete recheck check magnesium     DVT prophylaxis: Lovenox Code Status: Full code Family Communication: None Disposition Plan: Pending clinical improvement Consults called: GI and general surgery Admission status: Observation   Alwyn Ren MD [facility]  If 7PM-7AM, please contact night-coverage [email or web address] Password [facility]  12/29/2017, 4:39 PM

## 2017-12-30 ENCOUNTER — Encounter (HOSPITAL_COMMUNITY): Payer: Self-pay | Admitting: Anesthesiology

## 2017-12-30 ENCOUNTER — Observation Stay (HOSPITAL_COMMUNITY): Payer: 59 | Admitting: Anesthesiology

## 2017-12-30 ENCOUNTER — Encounter (HOSPITAL_COMMUNITY)
Admission: EM | Disposition: A | Discharge status: Home / Self Care | Payer: Self-pay | Source: Home / Self Care | Attending: Internal Medicine

## 2017-12-30 DIAGNOSIS — F419 Anxiety disorder, unspecified: Secondary | ICD-10-CM | POA: Diagnosis present

## 2017-12-30 DIAGNOSIS — K2900 Acute gastritis without bleeding: Secondary | ICD-10-CM | POA: Diagnosis not present

## 2017-12-30 DIAGNOSIS — K210 Gastro-esophageal reflux disease with esophagitis: Secondary | ICD-10-CM | POA: Diagnosis not present

## 2017-12-30 DIAGNOSIS — K3189 Other diseases of stomach and duodenum: Secondary | ICD-10-CM | POA: Diagnosis not present

## 2017-12-30 DIAGNOSIS — R112 Nausea with vomiting, unspecified: Secondary | ICD-10-CM

## 2017-12-30 DIAGNOSIS — K228 Other specified diseases of esophagus: Secondary | ICD-10-CM | POA: Diagnosis not present

## 2017-12-30 DIAGNOSIS — R1013 Epigastric pain: Secondary | ICD-10-CM | POA: Diagnosis not present

## 2017-12-30 DIAGNOSIS — R1084 Generalized abdominal pain: Secondary | ICD-10-CM | POA: Diagnosis not present

## 2017-12-30 DIAGNOSIS — F329 Major depressive disorder, single episode, unspecified: Secondary | ICD-10-CM | POA: Diagnosis present

## 2017-12-30 DIAGNOSIS — F17210 Nicotine dependence, cigarettes, uncomplicated: Secondary | ICD-10-CM | POA: Diagnosis present

## 2017-12-30 DIAGNOSIS — E876 Hypokalemia: Secondary | ICD-10-CM | POA: Diagnosis present

## 2017-12-30 DIAGNOSIS — Z885 Allergy status to narcotic agent status: Secondary | ICD-10-CM | POA: Diagnosis not present

## 2017-12-30 DIAGNOSIS — K209 Esophagitis, unspecified: Secondary | ICD-10-CM | POA: Diagnosis not present

## 2017-12-30 DIAGNOSIS — Z79899 Other long term (current) drug therapy: Secondary | ICD-10-CM | POA: Diagnosis not present

## 2017-12-30 DIAGNOSIS — M5416 Radiculopathy, lumbar region: Secondary | ICD-10-CM | POA: Diagnosis present

## 2017-12-30 DIAGNOSIS — Z9884 Bariatric surgery status: Secondary | ICD-10-CM | POA: Diagnosis not present

## 2017-12-30 HISTORY — PX: ESOPHAGOGASTRODUODENOSCOPY (EGD) WITH PROPOFOL: SHX5813

## 2017-12-30 LAB — CBC
HEMATOCRIT: 40.9 % (ref 36.0–46.0)
HEMOGLOBIN: 13.8 g/dL (ref 12.0–15.0)
MCH: 34.2 pg — ABNORMAL HIGH (ref 26.0–34.0)
MCHC: 33.7 g/dL (ref 30.0–36.0)
MCV: 101.5 fL — AB (ref 80.0–100.0)
Platelets: 256 10*3/uL (ref 150–400)
RBC: 4.03 MIL/uL (ref 3.87–5.11)
RDW: 13.6 % (ref 11.5–15.5)
WBC: 5 10*3/uL (ref 4.0–10.5)
nRBC: 0 % (ref 0.0–0.2)

## 2017-12-30 LAB — GLUCOSE, CAPILLARY: Glucose-Capillary: 103 mg/dL — ABNORMAL HIGH (ref 70–99)

## 2017-12-30 LAB — MAGNESIUM: Magnesium: 2 mg/dL (ref 1.7–2.4)

## 2017-12-30 LAB — HIV ANTIBODY (ROUTINE TESTING W REFLEX): HIV Screen 4th Generation wRfx: NONREACTIVE

## 2017-12-30 SURGERY — ESOPHAGOGASTRODUODENOSCOPY (EGD) WITH PROPOFOL
Anesthesia: Monitor Anesthesia Care

## 2017-12-30 MED ORDER — SODIUM CHLORIDE 0.9 % IV SOLN: INTRAVENOUS | Status: DC

## 2017-12-30 MED ORDER — LACTATED RINGERS IV SOLN
INTRAVENOUS | Status: DC | PRN
  Administered 2017-12-30: 12:00:00 via INTRAVENOUS

## 2017-12-30 MED ORDER — PROPOFOL 10 MG/ML IV BOLUS
INTRAVENOUS | Status: AC
  Filled 2017-12-30: qty 20

## 2017-12-30 MED ORDER — PROPOFOL 500 MG/50ML IV EMUL
INTRAVENOUS | Status: DC | PRN
  Administered 2017-12-30: 125 ug/kg/min via INTRAVENOUS

## 2017-12-30 MED ORDER — DIPHENHYDRAMINE HCL 50 MG/ML IJ SOLN
25.0000 mg | Freq: Four times a day (QID) | INTRAMUSCULAR | Status: DC | PRN
  Administered 2017-12-30 (×2): 25 mg via INTRAVENOUS
  Filled 2017-12-30 (×2): qty 1

## 2017-12-30 MED ORDER — DIPHENHYDRAMINE HCL 50 MG/ML IJ SOLN: 12.5000 mg | Freq: Four times a day (QID) | INTRAMUSCULAR | Status: DC | PRN

## 2017-12-30 MED ORDER — PROPOFOL 10 MG/ML IV BOLUS
INTRAVENOUS | Status: DC | PRN
  Administered 2017-12-30 (×2): 20 mg via INTRAVENOUS
  Administered 2017-12-30: 40 mg via INTRAVENOUS
  Administered 2017-12-30 (×4): 20 mg via INTRAVENOUS

## 2017-12-30 MED ORDER — KCL IN DEXTROSE-NACL 20-5-0.45 MEQ/L-%-% IV SOLN
INTRAVENOUS | Status: DC
  Administered 2017-12-30 (×2): via INTRAVENOUS
  Filled 2017-12-30 (×3): qty 1000

## 2017-12-30 MED ORDER — PROPOFOL 10 MG/ML IV BOLUS
INTRAVENOUS | Status: AC
  Filled 2017-12-30: qty 40

## 2017-12-30 SURGICAL SUPPLY — 15 items

## 2017-12-30 NOTE — Transfer of Care (Signed)
Immediate Anesthesia Transfer of Care Note  Patient: Samantha Duarte  Procedure(s) Performed: ESOPHAGOGASTRODUODENOSCOPY (EGD) WITH PROPOFOL (N/A )  Patient Location: Endoscopy Unit  Anesthesia Type:MAC  Level of Consciousness: awake, alert , oriented and patient cooperative  Airway & Oxygen Therapy: Patient Spontanous Breathing and Patient connected to nasal cannula oxygen  Post-op Assessment: Report given to RN and Post -op Vital signs reviewed and stable  Post vital signs: Reviewed and stable  Last Vitals:  Vitals Value Taken Time  BP    Temp    Pulse    Resp    SpO2      Last Pain:  Vitals:   12/30/17 1200  TempSrc:   PainSc: 8       Patients Stated Pain Goal: 3 (16/10/96 0454)  Complications: No apparent anesthesia complications

## 2017-12-30 NOTE — Brief Op Note (Signed)
  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 Z-line 38 cm from the incisors and gastric narrowing presumed to be the level of the lap band was 46 cm from the incisors. Question whether the lap band is embedded or eroded into gastric lining causing this functional obstruction and feel that removal of the lap band is needed. NPO. Supportive care. Continue Protonix 40 mg IV BID.

## 2017-12-30 NOTE — Progress Notes (Signed)
Assessment & Plan: HD#2 - persistent nausea, inability to tolerate po intake  Hx of lap band performed in [place], [place], in 2011  UGI with narrowing/obstruction at Duarte of band  PPI, IVF, NPO currently  Will discuss with Dr. Bosie Clos now regarding proceeding with EGD today.  From [facility] notes yesterday:  "She was given IV fluids and admitted to medicine -Upper GI was obtained which showed high-grade luminal narrowing of the gastric lumen at the Duarte of the band with stasis of water-soluble contrast. -I reviewed her images and the case with one of my bariatric surgeon partners, Dr. Andrey Campanile. He was able to verify with my that the band appeared in appropriate position. -The plan moving forward will be to start her on twice daily IV PPI and obtain a gastroenterology consult -We will plan for her to have an EGD to assess the esophagus.  If significant esophagitis will plan treatment with PPI and time.  Additionally, if the mucosa appears normal and this appears to be all secondary to scarring from her band, would consider removal -For the time being, maintain n.p.o. status, IV hydration -I requested the PPI from pharmacy who approved it and will be ordering it for the patient  Samantha Duarte. Samantha Duarte, M.D. [facility]         Samantha Level, MD       [facility]       Office: [phone]   Chief Complaint: Nausea, inability to tolerate po intake, abd pain  Subjective: Patient in bed, husband at bedside, nurse in room.  Pleasant, comfortable.  Complains of upper abd discomfort.  Objective: Vital signs in last 24 hours: Temp:  [98.3 F (36.8 C)-98.7 F (37.1 C)] 98.3 F (36.8 C) (10/26 0439) Pulse Rate:  [67-103] 67 (10/26 0439) Resp:  [16-19] 18 (10/26 0439) BP: (125-169)/(74-110) 125/85 (10/26 0439) SpO2:  [95 %-100 %] 99 % (10/26 0439) Last BM Date: 12/28/17  Intake/Output from previous day: 10/25 0701 - 10/26 0700 In: 1000 [IV  Piggyback:1000] Out: -  Intake/Output this shift: No intake/output data recorded.  Physical Exam: HEENT - sclerae clear, mucous membranes moist Neck - soft Chest - clear bilaterally Cor - RRR Abdomen - soft, mild tenderness epigastrium and LUQ Ext - no edema, non-tender Neuro - alert & oriented, no focal deficits  Lab Results:  Recent Labs    12/29/17 0853 12/30/17 0524  WBC 5.5 5.0  HGB 12.9 13.8  HCT 38.7 40.9  PLT 214 256   BMET Recent Labs    12/29/17 0853 12/29/17 1826  NA 140 141  K 3.2* 3.3*  CL 104 106  CO2 27 27  GLUCOSE 102* 102*  BUN 12 9  CREATININE 0.86 0.73  CALCIUM 9.2 8.9   PT/INR No results for input(s): LABPROT, INR in the last 72 hours. Comprehensive Metabolic Panel:    Component Value Date/Time   NA 141 12/29/2017 1826   NA 140 12/29/2017 0853   K 3.3 (L) 12/29/2017 1826   K 3.2 (L) 12/29/2017 0853   CL 106 12/29/2017 1826   CL 104 12/29/2017 0853   CO2 27 12/29/2017 1826   CO2 27 12/29/2017 0853   BUN 9 12/29/2017 1826   BUN 12 12/29/2017 0853   CREATININE 0.73 12/29/2017 1826   CREATININE 0.86 12/29/2017 0853   GLUCOSE 102 (H) 12/29/2017 1826   GLUCOSE 102 (H) 12/29/2017 0853   CALCIUM 8.9 12/29/2017 1826   CALCIUM 9.2 12/29/2017 0853   AST  27 12/29/2017 0853   AST 13 03/29/2017 1529   ALT 18 12/29/2017 0853   ALT 9 03/29/2017 1529   ALKPHOS 60 12/29/2017 0853   ALKPHOS 62 03/29/2017 1529   BILITOT 0.9 12/29/2017 0853   BILITOT 0.4 03/29/2017 1529   PROT 6.7 12/29/2017 0853   PROT 7.1 03/29/2017 1529   ALBUMIN 3.9 12/29/2017 0853   ALBUMIN 4.2 03/29/2017 1529    Studies/Results: Ct Abdomen Pelvis W Contrast  Result Date: 12/29/2017 CLINICAL DATA:  Acute generalized abdominal pain. EXAM: CT ABDOMEN AND PELVIS WITH CONTRAST TECHNIQUE: Multidetector CT imaging of the abdomen and pelvis was performed using the standard protocol following bolus administration of intravenous contrast. CONTRAST:  ISOVUE-300 IOPAMIDOL  (ISOVUE-300) INJECTION 61% COMPARISON:  None. FINDINGS: Lower chest: Mild sliding-type hiatal hernia is noted. Visualized lung bases are unremarkable. Hepatobiliary: No focal liver abnormality is seen. Status post cholecystectomy. No biliary dilatation. Pancreas: Unremarkable. No pancreatic ductal dilatation or surrounding inflammatory changes. Spleen: Normal in size without focal abnormality. Adrenals/Urinary Tract: Adrenal glands are unremarkable. Kidneys are normal, without renal calculi, focal lesion, or hydronephrosis. Bladder is unremarkable. Stomach/Bowel: Status post lap band procedure. This appears to be in grossly good position. There is no evidence of bowel obstruction or inflammation. The appendix appears normal. Vascular/Lymphatic: No significant vascular findings are present. No enlarged abdominal or pelvic lymph nodes. Reproductive: Uterus and bilateral adnexa are unremarkable. Other: No abdominal wall hernia or abnormality. No abdominopelvic ascites. Musculoskeletal: No acute or significant osseous findings. IMPRESSION: Status post lap band procedure. This appears to be in grossly good position. Mild sliding-type hiatal hernia. No other abnormality seen in the abdomen or pelvis. Electronically Signed   By: Lupita Raider, M.D.   On: 12/29/2017 11:39   Dg Kayleen Memos W/water Sol Cm  Result Date: 12/29/2017 CLINICAL DATA:  Nausea and vomiting for 1 day. History of lap band in 2011 EXAM: WATER SOLUBLE UPPER GI SERIES TECHNIQUE: Single-column upper GI series was performed using water soluble contrast. COMPARISON:  CT 12/29/2017 FLUOROSCOPY TIME:  Fluoroscopy Time:  1 minutes 24 seconds Radiation Exposure Index (if provided by the fluoroscopic device): 52.3 mGy Number of Acquired Spot Images: 0 FINDINGS: The scout radiograph shows the lap band within the expected location just below the GE junction. The band is oriented in the expected 2 o'clock to 8 o'clock position. The band port and catheter appear  intact. In the upright orientation the patient ingested approximately 25 cc of water-soluble contrast material. This promptly passed to the distal esophagus which appears increased in caliber which measures up to 5.3 cm in diameter. There is marked narrowing of the lumen through the lap band measuring approximately 2 mm in diameter. There is marked stasis of the water-soluble contrast material within the dilated distal esophagus. Several episodes of a thin, hairline, channel of contrast passed into the stomach. At least 1 episode of vomiting was witnessed during this exam. IMPRESSION: 1. High grade luminal narrowing of the gastric lumen at the Duarte of the gastric band which results in marked stasis of water-soluble contrast material within the dilated distal esophagus. On the comparison CT from today there is thickening of the distal esophageal wall and mucosa within the band. On the CT the esophageal wall thickness measures up to 1.2 cm. No mass is identified on the CT from today. Findings may reflect changes due to severe esophagitis. Electronically Signed   By: Signa Kell M.D.   On: 12/29/2017 17:16      Samantha Duarte Judie Petit  12/30/2017  Patient ID: Samantha Duarte, female   DOB: 1970-12-05, 47 y.o.   MRN: [id number]

## 2017-12-30 NOTE — Progress Notes (Signed)
[facility] PROGRESS NOTE    Progress Note  Samantha Duarte  ZOX:[id number] DOB: 1970/07/14 DOA: 12/29/2017 PCP: Helane Rima, DO     Brief Narrative:   Samantha Duarte is an 47 y.o. female history of laparoscopic gastric banding in 2018, cholecystectomy, admitted with intractable nausea vomiting and abdominal pain, she reports that her last bowel movement was a day prior to admission she has had no lattice.  Denies any dysphagia odynophagia some squeezing epigastric pain  Assessment/Plan:   Active Problems:   History of laparoscopic adjustable gastric banding   Intractable nausea and vomiting Surgery evaluated the patient and recommended a upper GI series and start her on Protonix IV twice daily. Upper GI series done on 12/29/2017 showed High grade luminal narrowing of the gastric lumen at the level of the gastric band which results in marked stasis of water-soluble contrast material within the dilated distal esophagus. Re-consult surgery.   DVT prophylaxis: lovenox Family Communication:none Disposition Plan/Barrier to D/C: unable to determine Code Status:     Code Status Orders  (From admission, onward)         Start     Ordered   12/29/17 1639  Full code  Continuous     12/29/17 1638        Code Status History    This patient has a current code status but no historical code status.        IV Access:    Peripheral IV   Procedures and diagnostic studies:   Ct Abdomen Pelvis W Contrast  Result Date: 12/29/2017 CLINICAL DATA:  Acute generalized abdominal pain. EXAM: CT ABDOMEN AND PELVIS WITH CONTRAST TECHNIQUE: Multidetector CT imaging of the abdomen and pelvis was performed using the standard protocol following bolus administration of intravenous contrast. CONTRAST:  ISOVUE-300 IOPAMIDOL (ISOVUE-300) INJECTION 61% COMPARISON:  None. FINDINGS: Lower chest: Mild sliding-type hiatal hernia is noted. Visualized lung bases are unremarkable.  Hepatobiliary: No focal liver abnormality is seen. Status post cholecystectomy. No biliary dilatation. Pancreas: Unremarkable. No pancreatic ductal dilatation or surrounding inflammatory changes. Spleen: Normal in size without focal abnormality. Adrenals/Urinary Tract: Adrenal glands are unremarkable. Kidneys are normal, without renal calculi, focal lesion, or hydronephrosis. Bladder is unremarkable. Stomach/Bowel: Status post lap band procedure. This appears to be in grossly good position. There is no evidence of bowel obstruction or inflammation. The appendix appears normal. Vascular/Lymphatic: No significant vascular findings are present. No enlarged abdominal or pelvic lymph nodes. Reproductive: Uterus and bilateral adnexa are unremarkable. Other: No abdominal wall hernia or abnormality. No abdominopelvic ascites. Musculoskeletal: No acute or significant osseous findings. IMPRESSION: Status post lap band procedure. This appears to be in grossly good position. Mild sliding-type hiatal hernia. No other abnormality seen in the abdomen or pelvis. Electronically Signed   By: Lupita Raider, M.D.   On: 12/29/2017 11:39   Dg Kayleen Memos W/water Sol Cm  Result Date: 12/29/2017 CLINICAL DATA:  Nausea and vomiting for 1 day. History of lap band in 2011 EXAM: WATER SOLUBLE UPPER GI SERIES TECHNIQUE: Single-column upper GI series was performed using water soluble contrast. COMPARISON:  CT 12/29/2017 FLUOROSCOPY TIME:  Fluoroscopy Time:  1 minutes 24 seconds Radiation Exposure Index (if provided by the fluoroscopic device): 52.3 mGy Number of Acquired Spot Images: 0 FINDINGS: The scout radiograph shows the lap band within the expected location just below the GE junction. The band is oriented in the expected 2 o'clock to 8 o'clock position. The band port and catheter appear intact. In the upright orientation  the patient ingested approximately 25 cc of water-soluble contrast material. This promptly passed to the distal esophagus  which appears increased in caliber which measures up to 5.3 cm in diameter. There is marked narrowing of the lumen through the lap band measuring approximately 2 mm in diameter. There is marked stasis of the water-soluble contrast material within the dilated distal esophagus. Several episodes of a thin, hairline, channel of contrast passed into the stomach. At least 1 episode of vomiting was witnessed during this exam. IMPRESSION: 1. High grade luminal narrowing of the gastric lumen at the level of the gastric band which results in marked stasis of water-soluble contrast material within the dilated distal esophagus. On the comparison CT from today there is thickening of the distal esophageal wall and mucosa within the band. On the CT the esophageal wall thickness measures up to 1.2 cm. No mass is identified on the CT from today. Findings may reflect changes due to severe esophagitis. Electronically Signed   By: Signa Kell M.D.   On: 12/29/2017 17:16     Medical Consultants:    None.  Anti-Infectives:   None  Subjective:    Samantha Duarte she relates she continues to be nauseated some bloating her abdomen.  Has not been passing stools.  Objective:    Vitals:   12/29/17 1541 12/29/17 1546 12/29/17 2040 12/30/17 0439  BP: (!) 156/109 (!) 169/74 (!) 131/93 125/85  Pulse: 85 (!) 103 71 67  Resp: 16 19 18 18   Temp:   98.7 F (37.1 C) 98.3 F (36.8 C)  TempSrc:   Oral Oral  SpO2: 98% 95% 100% 99%    Intake/Output Summary (Last 24 hours) at 12/30/2017 0815 Last data filed at 12/29/2017 1020 Gross per 24 hour  Intake 1000 ml  Output -  Net 1000 ml   There were no vitals filed for this visit.  Exam: General exam: In no acute distress. Respiratory system: Good air movement and clear to auscultation. Cardiovascular system: S1 & S2 heard, RRR.  Gastrointestinal system: Abdomen is nondistended, soft and nontender.  Central nervous system: Alert and oriented. No focal neurological  deficits. Extremities: No pedal edema. Skin: No rashes, lesions or ulcers Psychiatry: Judgement and insight appear normal. Mood & affect appropriate.    Data Reviewed:    Labs: Basic Metabolic Panel: Recent Labs  Lab 12/29/17 0853 12/29/17 1826 12/30/17 0524  NA 140 141  --   K 3.2* 3.3*  --   CL 104 106  --   CO2 27 27  --   GLUCOSE 102* 102*  --   BUN 12 9  --   CREATININE 0.86 0.73  --   CALCIUM 9.2 8.9  --   MG  --   --  2.0   GFR CrCl cannot be calculated (Unknown ideal weight.). Liver Function Tests: Recent Labs  Lab 12/29/17 0853  AST 27  ALT 18  ALKPHOS 60  BILITOT 0.9  PROT 6.7  ALBUMIN 3.9   Recent Labs  Lab 12/29/17 0853  LIPASE 32   No results for input(s): AMMONIA in the last 168 hours. Coagulation profile No results for input(s): INR, PROTIME in the last 168 hours.  CBC: Recent Labs  Lab 12/29/17 0853 12/30/17 0524  WBC 5.5 5.0  NEUTROABS 2.6  --   HGB 12.9 13.8  HCT 38.7 40.9  MCV 101.8* 101.5*  PLT 214 256   Cardiac Enzymes: No results for input(s): CKTOTAL, CKMB, CKMBINDEX, TROPONINI in the last 168 hours.  BNP (last 3 results) No results for input(s): PROBNP in the last 8760 hours. CBG: Recent Labs  Lab 12/29/17 2028 12/30/17 0030  GLUCAP 91 103*   D-Dimer: No results for input(s): DDIMER in the last 72 hours. Hgb A1c: No results for input(s): HGBA1C in the last 72 hours. Lipid Profile: No results for input(s): CHOL, HDL, LDLCALC, TRIG, CHOLHDL, LDLDIRECT in the last 72 hours. Thyroid function studies: No results for input(s): TSH, T4TOTAL, T3FREE, THYROIDAB in the last 72 hours.  Invalid input(s): FREET3 Anemia work up: No results for input(s): VITAMINB12, FOLATE, FERRITIN, TIBC, IRON, RETICCTPCT in the last 72 hours. Sepsis Labs: Recent Labs  Lab 12/29/17 0853 12/30/17 0524  WBC 5.5 5.0   Microbiology No results found for this or any previous visit (from the past 240 hour(s)).   Medications:   .  dexamethasone  4 mg Intravenous Q12H  . enoxaparin (LOVENOX) injection  40 mg Subcutaneous Q24H  . pantoprazole (PROTONIX) IV  40 mg Intravenous Q12H  . sucralfate  1 g Oral TID WC & HS   Continuous Infusions: . sodium chloride 125 mL/hr at 12/29/17 1041  . famotidine (PEPCID) IV 20 mg (12/29/17 2258)     LOS: 0 days   Marinda Elk  [facility] Pager [phone]  *Please refer to [email or web address], password [email or web address] to get updated schedule on who will round on this patient, as hospitalists switch teams weekly. If 7PM-7AM, please contact night-coverage at [email or web address], password [email or web address] for any overnight needs.  12/30/2017, 8:15 AM

## 2017-12-30 NOTE — Op Note (Addendum)
[facility] Patient Name: Samantha Duarte Procedure Date: 12/30/2017 MRN: [id number] Attending MD: Shirley Friar , MD Date of Birth: 19-May-1970 CSN: [id number] Age: 47 Admit Type: Inpatient Procedure:                Upper GI endoscopy Indications:              Epigastric abdominal pain, Nausea with vomiting Providers:                Shirley Friar, MD, Dwain Sarna, RN, Lawson Radar, Technician, Nadene Rubins, CRNA Referring MD:             Velora Heckler Medicines:                Propofol per Anesthesia, Monitored Anesthesia Care Complications:            No immediate complications. Estimated Blood Loss:     Estimated blood loss: none. Procedure:                Pre-Anesthesia Assessment:                           - Prior to the procedure, a History and Physical                            was performed, and patient medications and                            allergies were reviewed. The patient's tolerance of                            previous anesthesia was also reviewed. The risks                            and benefits of the procedure and the sedation                            options and risks were discussed with the patient.                            All questions were answered, and informed consent                            was obtained. Prior Anticoagulants: The patient has                            taken no previous anticoagulant or antiplatelet                            agents. ASA Grade Assessment: II - A patient with                            mild systemic disease. After reviewing the risks  and benefits, the patient was deemed in                            satisfactory condition to undergo the procedure.                           After obtaining informed consent, the endoscope was                            passed under direct vision. Throughout the                            procedure,  the patient's blood pressure, pulse, and                            oxygen saturations were monitored continuously. The                            GIF-H190 ([id number] Olympus adult endoscope was                            introduced through the mouth, and advanced to the                            second part of duodenum. The upper GI endoscopy was                            accomplished without difficulty. The patient                            tolerated the procedure well. Scope In: Scope Out: Findings:      The lumen of the mid esophagus and distal esophagus was moderately       dilated.      Fluid was found in the mid esophagus and in the distal esophagus.      LA Grade B (one or more mucosal breaks greater than 5 mm, not extending       between the tops of two mucosal folds) esophagitis with no bleeding was       found at the gastroesophageal junction (38 cm from the incisors).      Segmental moderate mucosal changes characterized by congestion were       found in the cardia.      Large amount of foamy fluid in proximal stomach above narrowed segment       and in mid-distal esophagus. Mild to moderate resistance during passage       of endoscope distal to narrowed segment (presumed level of the lap       band). Minimal inflammation in distal stomach noted.      Segmental minimal inflammation characterized by congestion (edema) was       found in the gastric antrum.      The cardia and gastric fundus were normal on retroflexion.      The examined duodenum was normal. Impression:               - Dilation in the mid esophagus and in the distal  esophagus.                           - Fluid in the mid esophagus and in the distal                            esophagus.                           - LA Grade B reflux esophagitis.                           - Congested mucosa in the cardia.                           - Acute gastritis.                           -  Normal examined duodenum.                           - No specimens collected.                           - Distal esophagus chronically dilated due to                            functional obstruction from lap band. Mild to                            moderate esophagitis likely due to chronic                            obstruction of gastric contents and food products                            from the lap band. Z- line 38 cm from the incisors                            and narrowed part of stomach presumed to be the                            level of the lap band was 46 cm from the incisors.                            Question whether the lap band is embedded or eroded                            into gastric lining causing this functional                            obstruction and feel that removal of the lap band                            is needed. NPO.  Supportive care. Continue Protonix                            40 mg IV BID. Moderate Sedation:      N/A- Per Anesthesia Care Recommendation:           - NPO.                           - Observe patient's clinical course.                           - Use Protonix (pantoprazole) 40 mg IV BID. Procedure Code(s):        --- Professional ---                           [phone], Esophagogastroduodenoscopy, flexible,                            transoral; diagnostic, including collection of                            specimen(s) by brushing or washing, when performed                            (separate procedure) Diagnosis Code(s):        --- Professional ---                           R11.2, Nausea with vomiting, unspecified                           R10.13, Epigastric pain                           K29.00, Acute gastritis without bleeding                           K21.0, Gastro-esophageal reflux disease with                            esophagitis                           K22.8, Other specified diseases of esophagus                            K31.89, Other diseases of stomach and duodenum CPT copyright 2018 [employer]. All rights reserved. The codes documented in this report are preliminary and upon coder review may  be revised to meet current compliance requirements. Shirley Friar, MD 12/30/2017 12:45:17 PM This report has been signed electronically. Number of Addenda: 0

## 2017-12-30 NOTE — H&P (View-Only) (Signed)
Referring Provider: Dr. Gerkin Primary Care Physician:  Wallace, Erica, DO Primary Gastroenterologist:  Unassigned  Reason for Consultation:  Nausea and Vomiting; Epigastric pain  HPI: Samantha Duarte is a 47 y.o. female who is s/p lap gastric band (proximal stomach) placed in 2008 that was deflated in 2012 and fluid was removed in 2013 who has done fairly well since 2013 with eating. Has had occasional vomiting and chronic GERD that is usually well-controlled with Zantac or Pepcid. Starting last Thursday she developed a "tightness" in her epigastric area after wearing a spanx for a dress. The tightness sensation persisted after taking off the dress and also began having recurrent N/V to the point that she could not keep anything down since Thursday. Has lost 40 pounds since moving to  but not sure if some of weight loss was due to her move to [place]. CT negative for obstruction but esophageal wall proximal to gastric band is thickened. 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 Preop weight of 268 pounds and lowest postop weight of 149 pounds and currently around 153 pounds. Denies family history of stomach cancer or colon cancer. Last EGD was prior to the lap band. Denies every having a colonoscopy. Husband in room.  Past Medical History:  Diagnosis Date  . Anxiety   . Depression   . 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 Tobacco use 09/29/2016    Past Surgical History:  Procedure Laterality Date  . CHOLECYSTECTOMY    . LAPAROSCOPIC ROUX-EN-Y GASTRIC BYPASS WITH UPPER ENDOSCOPY AND REMOVAL OF LAP BAND      Prior to Admission medications   Medication Sig Start Date End Date Taking? Authorizing Provider  busPIRone (BUSPAR) 10 MG tablet TAKE 1 TABLET (10 MG TOTAL) BY MOUTH 3 (THREE) TIMES DAILY. Patient taking differently: Take 10 mg by mouth daily.  12/22/17  Yes Wallace, Erica, DO  cyclobenzaprine  (FLEXERIL) 5 MG tablet TAKE 1 TABLET (5 MG TOTAL) BY MOUTH 3 (THREE) TIMES DAILY AS NEEDED FOR MUSCLE SPASMS. 11/24/17  Yes Wallace, Erica, DO  escitalopram (LEXAPRO) 20 MG tablet TAKE 1 TABLET (20 MG TOTAL) BY MOUTH DAILY. 07/06/17  Yes Wallace, Erica, DO  famotidine (PEPCID) 20 MG tablet Take 20 mg by mouth 2 (two) times daily.   Yes [provider]  gabapentin (NEURONTIN) 300 MG capsule Start with 1 tab po qhs X 1 week, then increase to 1 tab po bid X 1 week then 1 tab po tid prn Patient taking differently: Take 300 mg by mouth at bedtime.  11/23/17  Yes Rigby, Michael D, DO  hydrOXYzine (ATARAX/VISTARIL) 25 MG tablet Take 1 tablet (25 mg total) by mouth every 6 (six) hours. Patient not taking: Reported on 12/29/2017 09/29/16   Wallace, Erica, DO  ketorolac (TORADOL) 10 MG tablet Take 1 tablet (10 mg total) by mouth every 6 (six) hours as needed. Patient not taking: Reported on 12/29/2017 10/09/17   Wallace, Erica, DO  traZODone (DESYREL) 50 MG tablet Take 0.5-1 tablets (25-50 mg total) by mouth at bedtime as needed for sleep. Patient not taking: Reported on 12/29/2017 10/09/17   Wallace, Erica, DO    Scheduled Meds: . dexamethasone  4 mg Intravenous Q12H  . enoxaparin (LOVENOX) injection  40 mg Subcutaneous Q24H  . pantoprazole (PROTONIX) IV  40 mg Intravenous Q12H  . sucralfate  1 g Oral TID WC & HS   Continuous Infusions: . sodium chloride 100 mL/hr at 12/30/17   0852  . famotidine (PEPCID) IV 20 mg (12/30/17 1035)   PRN Meds:.diphenhydrAMINE, ondansetron (ZOFRAN) IV  Allergies as of 12/29/2017 - Review Complete 12/29/2017  Allergen Reaction Noted  . Morphine and related Nausea Only 09/29/2016    No family history on file.  Social History   Socioeconomic History  . Marital status: Married    Spouse name: Not on file  . Number of children: Not on file  . Years of education: Not on file  . Highest education level: Not on file  Occupational History  . Not on file  Social  Needs  . Financial resource strain: Not on file  . Food insecurity:    Worry: Not on file    Inability: Not on file  . Transportation needs:    Medical: Not on file    Non-medical: Not on file  Tobacco Use  . Smoking status: Current Every Day Smoker    Packs/day: 0.50    Types: Cigarettes  . Smokeless tobacco: Never Used  Substance and Sexual Activity  . Alcohol use: Yes    Alcohol/week: 14.0 standard drinks    Types: 14 Glasses of wine per week  . Drug use: No  . Sexual activity: Yes  Lifestyle  . Physical activity:    Days per week: Not on file    Minutes per session: Not on file  . Stress: Not on file  Relationships  . Social connections:    Talks on phone: Not on file    Gets together: Not on file    Attends religious service: Not on file    Active member of club or organization: Not on file    Attends meetings of clubs or organizations: Not on file    Relationship status: Not on file  . Intimate partner violence:    Fear of current or ex partner: Not on file    Emotionally abused: Not on file    Physically abused: Not on file    Forced sexual activity: Not on file  Other Topics Concern  . Not on file  Social History Narrative  . Not on file    Review of Systems: All negative except as stated above in HPI.  Physical Exam: Vital signs: Vitals:   12/29/17 2040 12/30/17 0439  BP: (!) 131/93 125/85  Pulse: 71 67  Resp: 18 18  Temp: 98.7 F (37.1 C) 98.3 F (36.8 C)  SpO2: 100% 99%   Last BM Date: 12/28/17 General:   Alert,  Well-developed, well-nourished, pleasant and cooperative in NAD Head: normocephalic, atraumatic Eyes: anicteric sclera ENT: oropharynx clear Neck: supple, nontender Lungs:  Clear throughout to auscultation.   No wheezes, crackles, or rhonchi. No acute distress. Heart:  Regular rate and rhythm; no murmurs, clicks, rubs,  or gallops. Abdomen: epigastric tenderness with guarding, otherwise nontender, mild distention, soft, +BS   Rectal:  Deferred Ext: no edema  GI:  Lab Results: Recent Labs    12/29/17 0853 12/30/17 0524  WBC 5.5 5.0  HGB 12.9 13.8  HCT 38.7 40.9  PLT 214 256   BMET Recent Labs    12/29/17 0853 12/29/17 1826  NA 140 141  K 3.2* 3.3*  CL 104 106  CO2 27 27  GLUCOSE 102* 102*  BUN 12 9  CREATININE 0.86 0.73  CALCIUM 9.2 8.9   LFT Recent Labs    12/29/17 0853  PROT 6.7  ALBUMIN 3.9  AST 27  ALT 18  ALKPHOS 60  BILITOT 0.9   PT/INR   No results for input(s): LABPROT, INR in the last 72 hours.   Studies/Results: Ct Abdomen Pelvis W Contrast  Result Date: 12/29/2017 CLINICAL DATA:  Acute generalized abdominal pain. EXAM: CT ABDOMEN AND PELVIS WITH CONTRAST TECHNIQUE: Multidetector CT imaging of the abdomen and pelvis was performed using the standard protocol following bolus administration of intravenous contrast. CONTRAST:  100mL ISOVUE-300 IOPAMIDOL (ISOVUE-300) INJECTION 61% COMPARISON:  None. FINDINGS: Lower chest: Mild sliding-type hiatal hernia is noted. Visualized lung bases are unremarkable. Hepatobiliary: No focal liver abnormality is seen. Status post cholecystectomy. No biliary dilatation. Pancreas: Unremarkable. No pancreatic ductal dilatation or surrounding inflammatory changes. Spleen: Normal in size without focal abnormality. Adrenals/Urinary Tract: Adrenal glands are unremarkable. Kidneys are normal, without renal calculi, focal lesion, or hydronephrosis. Bladder is unremarkable. Stomach/Bowel: Status post lap band procedure. This appears to be in grossly good position. There is no evidence of bowel obstruction or inflammation. The appendix appears normal. Vascular/Lymphatic: No significant vascular findings are present. No enlarged abdominal or pelvic lymph nodes. Reproductive: Uterus and bilateral adnexa are unremarkable. Other: No abdominal wall hernia or abnormality. No abdominopelvic ascites. Musculoskeletal: No acute or significant osseous findings. IMPRESSION:  Status post lap band procedure. This appears to be in grossly good position. Mild sliding-type hiatal hernia. No other abnormality seen in the abdomen or pelvis. Electronically Signed   By: James  Green Jr, M.D.   On: 12/29/2017 11:39   Dg Ugi W/water Sol Cm  Result Date: 12/29/2017 CLINICAL DATA:  Nausea and vomiting for 1 day. History of lap band in 2011 EXAM: WATER SOLUBLE UPPER GI SERIES TECHNIQUE: Single-column upper GI series was performed using water soluble contrast. COMPARISON:  CT 12/29/2017 FLUOROSCOPY TIME:  Fluoroscopy Time:  1 minutes 24 seconds Radiation Exposure Index (if provided by the fluoroscopic device): 52.3 mGy Number of Acquired Spot Images: 0 FINDINGS: The scout radiograph shows the lap band within the expected location just below the GE junction. The band is oriented in the expected 2 o'clock to 8 o'clock position. The band port and catheter appear intact. In the upright orientation the patient ingested approximately 25 cc of water-soluble contrast material. This promptly passed to the distal esophagus which appears increased in caliber which measures up to 5.3 cm in diameter. There is marked narrowing of the lumen through the lap band measuring approximately 2 mm in diameter. There is marked stasis of the water-soluble contrast material within the dilated distal esophagus. Several episodes of a thin, hairline, channel of contrast passed into the stomach. At least 1 episode of vomiting was witnessed during this exam. IMPRESSION: 1. High grade luminal narrowing of the gastric lumen at the level of the gastric band which results in marked stasis of water-soluble contrast material within the dilated distal esophagus. On the comparison CT from today there is thickening of the distal esophageal wall and mucosa within the band. On the CT the esophageal wall thickness measures up to 1.2 cm. No mass is identified on the CT from today. Findings may reflect changes due to severe esophagitis.  Electronically Signed   By: Taylor  Stroud M.D.   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 Question esophagitis either primary or secondary to the band eroding into the gastric wall. EGD requested by surgery to assess for esophagitis and tightening at the GEJ or cardia. EGD today. NPO for EGD. Supportive care.    LOS: 0 days     Talib Headley C Charlet Harr  12/30/2017, 11:35 AM  Questions please call [phone]  

## 2017-12-30 NOTE — Consult Note (Signed)
Referring Provider: Dr. Gerrit Friends Primary Care Physician:  Helane Rima, DO Primary Gastroenterologist:  Gentry Fitz  Reason for Consultation:  Nausea and Vomiting; Epigastric pain  HPI: Samantha Duarte is a 47 y.o. female who is s/p lap gastric band (proximal stomach) placed in 2008 that was deflated in 2012 and fluid was removed in 2013 who has done fairly well since 2013 with eating. Has had occasional vomiting and chronic GERD that is usually well-controlled with Zantac or Pepcid. Starting last Thursday she developed a "tightness" in her epigastric area after wearing a spanx for a dress. The tightness sensation persisted after taking off the dress and also began having recurrent N/V to the point that she could not keep anything down since Thursday. Has lost 40 pounds since moving to [place] but not sure if some of weight loss was due to her move to [employer]. CT negative for obstruction but esophageal wall proximal to gastric band is thickened. 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 Preop weight of 268 pounds and lowest postop weight of 149 pounds and currently around 153 pounds. Denies family history of stomach cancer or colon cancer. Last EGD was prior to the lap band. Denies every having a colonoscopy. Husband in room.  Past Medical History:  Diagnosis Date  . Anxiety   . Depression   . 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 Tobacco use 09/29/2016    Past Surgical History:  Procedure Laterality Date  . CHOLECYSTECTOMY    . LAPAROSCOPIC ROUX-EN-Y GASTRIC BYPASS WITH UPPER ENDOSCOPY AND REMOVAL OF LAP BAND      Prior to Admission medications   Medication Sig Start Date End Date Taking? Authorizing Provider  busPIRone (BUSPAR) 10 MG tablet TAKE 1 TABLET (10 MG TOTAL) BY MOUTH 3 (THREE) TIMES DAILY. Patient taking differently: Take 10 mg by mouth daily.  12/22/17  Yes Helane Rima, DO  cyclobenzaprine  (FLEXERIL) 5 MG tablet TAKE 1 TABLET (5 MG TOTAL) BY MOUTH 3 (THREE) TIMES DAILY AS NEEDED FOR MUSCLE SPASMS. 11/24/17  Yes Helane Rima, DO  escitalopram (LEXAPRO) 20 MG tablet TAKE 1 TABLET (20 MG TOTAL) BY MOUTH DAILY. 07/06/17  Yes Helane Rima, DO  famotidine (PEPCID) 20 MG tablet Take 20 mg by mouth 2 (two) times daily.   Yes [provider]  gabapentin (NEURONTIN) 300 MG capsule Start with 1 tab po qhs X 1 week, then increase to 1 tab po bid X 1 week then 1 tab po tid prn Patient taking differently: Take 300 mg by mouth at bedtime.  11/23/17  Yes Andrena Mews, DO  hydrOXYzine (ATARAX/VISTARIL) 25 MG tablet Take 1 tablet (25 mg total) by mouth every 6 (six) hours. Patient not taking: Reported on 12/29/2017 09/29/16   Helane Rima, DO  ketorolac (TORADOL) 10 MG tablet Take 1 tablet (10 mg total) by mouth every 6 (six) hours as needed. Patient not taking: Reported on 12/29/2017 10/09/17   Helane Rima, DO  traZODone (DESYREL) 50 MG tablet Take 0.5-1 tablets (25-50 mg total) by mouth at bedtime as needed for sleep. Patient not taking: Reported on 12/29/2017 10/09/17   Helane Rima, DO    Scheduled Meds: . dexamethasone  4 mg Intravenous Q12H  . enoxaparin (LOVENOX) injection  40 mg Subcutaneous Q24H  . pantoprazole (PROTONIX) IV  40 mg Intravenous Q12H  . sucralfate  1 g Oral TID WC & HS   Continuous Infusions: . sodium chloride 100 mL/hr at 12/30/17  4098  . famotidine (PEPCID) IV 20 mg (12/30/17 1035)   PRN Meds:.diphenhydrAMINE, ondansetron (ZOFRAN) IV  Allergies as of 12/29/2017 - Review Complete 12/29/2017  Allergen Reaction Noted  . Morphine and related Nausea Only 09/29/2016    No family history on file.  Social History   Socioeconomic History  . Marital status: Married    Spouse name: Not on file  . Number of children: Not on file  . Years of education: Not on file  . Highest education level: Not on file  Occupational History  . Not on file  Social  Needs  . Financial resource strain: Not on file  . Food insecurity:    Worry: Not on file    Inability: Not on file  . Transportation needs:    Medical: Not on file    Non-medical: Not on file  Tobacco Use  . Smoking status: Current Every Day Smoker    Packs/day: 0.50    Types: Cigarettes  . Smokeless tobacco: Never Used  Substance and Sexual Activity  . Alcohol use: Yes    Alcohol/week: 14.0 standard drinks    Types: 14 Glasses of wine per week  . Drug use: No  . Sexual activity: Yes  Lifestyle  . Physical activity:    Days per week: Not on file    Minutes per session: Not on file  . Stress: Not on file  Relationships  . Social connections:    Talks on phone: Not on file    Gets together: Not on file    Attends religious service: Not on file    Active member of club or organization: Not on file    Attends meetings of clubs or organizations: Not on file    Relationship status: Not on file  . Intimate partner violence:    Fear of current or ex partner: Not on file    Emotionally abused: Not on file    Physically abused: Not on file    Forced sexual activity: Not on file  Other Topics Concern  . Not on file  Social History Narrative  . Not on file    Review of Systems: All negative except as stated above in HPI.  Physical Exam: Vital signs: Vitals:   12/29/17 2040 12/30/17 0439  BP: (!) 131/93 125/85  Pulse: 71 67  Resp: 18 18  Temp: 98.7 F (37.1 C) 98.3 F (36.8 C)  SpO2: 100% 99%   Last BM Date: 12/28/17 General:   Alert,  Well-developed, well-nourished, pleasant and cooperative in NAD Head: normocephalic, atraumatic Eyes: anicteric sclera ENT: oropharynx clear Neck: supple, nontender Lungs:  Clear throughout to auscultation.   No wheezes, crackles, or rhonchi. No acute distress. Heart:  Regular rate and rhythm; no murmurs, clicks, rubs,  or gallops. Abdomen: epigastric tenderness with guarding, otherwise nontender, mild distention, soft, +BS   Rectal:  Deferred Ext: no edema  GI:  Lab Results: Recent Labs    12/29/17 0853 12/30/17 0524  WBC 5.5 5.0  HGB 12.9 13.8  HCT 38.7 40.9  PLT 214 256   BMET Recent Labs    12/29/17 0853 12/29/17 1826  NA 140 141  K 3.2* 3.3*  CL 104 106  CO2 27 27  GLUCOSE 102* 102*  BUN 12 9  CREATININE 0.86 0.73  CALCIUM 9.2 8.9   LFT Recent Labs    12/29/17 0853  PROT 6.7  ALBUMIN 3.9  AST 27  ALT 18  ALKPHOS 60  BILITOT 0.9   PT/INR  No results for input(s): LABPROT, INR in the last 72 hours.   Studies/Results: Ct Abdomen Pelvis W Contrast  Result Date: 12/29/2017 CLINICAL DATA:  Acute generalized abdominal pain. EXAM: CT ABDOMEN AND PELVIS WITH CONTRAST TECHNIQUE: Multidetector CT imaging of the abdomen and pelvis was performed using the standard protocol following bolus administration of intravenous contrast. CONTRAST:  ISOVUE-300 IOPAMIDOL (ISOVUE-300) INJECTION 61% COMPARISON:  None. FINDINGS: Lower chest: Mild sliding-type hiatal hernia is noted. Visualized lung bases are unremarkable. Hepatobiliary: No focal liver abnormality is seen. Status post cholecystectomy. No biliary dilatation. Pancreas: Unremarkable. No pancreatic ductal dilatation or surrounding inflammatory changes. Spleen: Normal in size without focal abnormality. Adrenals/Urinary Tract: Adrenal glands are unremarkable. Kidneys are normal, without renal calculi, focal lesion, or hydronephrosis. Bladder is unremarkable. Stomach/Bowel: Status post lap band procedure. This appears to be in grossly good position. There is no evidence of bowel obstruction or inflammation. The appendix appears normal. Vascular/Lymphatic: No significant vascular findings are present. No enlarged abdominal or pelvic lymph nodes. Reproductive: Uterus and bilateral adnexa are unremarkable. Other: No abdominal wall hernia or abnormality. No abdominopelvic ascites. Musculoskeletal: No acute or significant osseous findings. IMPRESSION:  Status post lap band procedure. This appears to be in grossly good position. Mild sliding-type hiatal hernia. No other abnormality seen in the abdomen or pelvis. Electronically Signed   By: Lupita Raider, M.D.   On: 12/29/2017 11:39   Dg Kayleen Memos W/water Sol Cm  Result Date: 12/29/2017 CLINICAL DATA:  Nausea and vomiting for 1 day. History of lap band in 2011 EXAM: WATER SOLUBLE UPPER GI SERIES TECHNIQUE: Single-column upper GI series was performed using water soluble contrast. COMPARISON:  CT 12/29/2017 FLUOROSCOPY TIME:  Fluoroscopy Time:  1 minutes 24 seconds Radiation Exposure Index (if provided by the fluoroscopic device): 52.3 mGy Number of Acquired Spot Images: 0 FINDINGS: The scout radiograph shows the lap band within the expected location just below the GE junction. The band is oriented in the expected 2 o'clock to 8 o'clock position. The band port and catheter appear intact. In the upright orientation the patient ingested approximately 25 cc of water-soluble contrast material. This promptly passed to the distal esophagus which appears increased in caliber which measures up to 5.3 cm in diameter. There is marked narrowing of the lumen through the lap band measuring approximately 2 mm in diameter. There is marked stasis of the water-soluble contrast material within the dilated distal esophagus. Several episodes of a thin, hairline, channel of contrast passed into the stomach. At least 1 episode of vomiting was witnessed during this exam. IMPRESSION: 1. High grade luminal narrowing of the gastric lumen at the level of the gastric band which results in marked stasis of water-soluble contrast material within the dilated distal esophagus. On the comparison CT from today there is thickening of the distal esophageal wall and mucosa within the band. On the CT the esophageal wall thickness measures up to 1.2 cm. No mass is identified on the CT from today. Findings may reflect changes due to severe esophagitis.  Electronically Signed   By: Signa Kell M.D.   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 Question esophagitis either primary or secondary to the band eroding into the gastric wall. EGD requested by surgery to assess for esophagitis and tightening at the GEJ or cardia. EGD today. NPO for EGD. Supportive care.    LOS: 0 days  Shirley Friar  12/30/2017, 11:35 AM  Questions please call [phone]

## 2017-12-30 NOTE — Interval H&P Note (Signed)
History and Physical Interval Note:  12/30/2017 12:12 PM  Samantha Duarte  has presented today for surgery, with the diagnosis of Nausea and Vomiting; Epigastric pain  The various methods of treatment have been discussed with the patient and family. After consideration of risks, benefits and other options for treatment, the patient has consented to  Procedure(s): ESOPHAGOGASTRODUODENOSCOPY (EGD) WITH PROPOFOL (N/A) as a surgical intervention .  The patient's history has been reviewed, patient examined, no change in status, stable for surgery.  I have reviewed the patient's chart and labs.  Questions were answered to the patient's satisfaction.     Shirley Friar

## 2017-12-30 NOTE — Anesthesia Preprocedure Evaluation (Signed)
Anesthesia Evaluation  Patient identified by MRN, date of birth, ID band Patient awake    Reviewed: Allergy & Precautions, NPO status , Patient's Chart, lab work & pertinent test results  Airway Mallampati: II  TM Distance: >3 FB Neck ROM: Full    Dental no notable dental hx. (+) Teeth Intact   Pulmonary neg pulmonary ROS, Current Smoker,    Pulmonary exam normal breath sounds clear to auscultation       Cardiovascular negative cardio ROS Normal cardiovascular exam Rhythm:Regular Rate:Normal     Neuro/Psych PSYCHIATRIC DISORDERS Anxiety Depression  Neuromuscular disease    GI/Hepatic Neg liver ROS, Nausea and vomiting Epigastric pain S/P Lap Gastric banding 09/2016   Endo/Other  negative endocrine ROS  Renal/GU negative Renal ROS  negative genitourinary   Musculoskeletal LBP with right radiculopathy   Abdominal (+) - obese,   Peds  Hematology negative hematology ROS (+)   Anesthesia Other Findings   Reproductive/Obstetrics negative OB ROS                             Anesthesia Physical Anesthesia Plan  ASA: II and emergent  Anesthesia Plan: MAC   Post-op Pain Management:    Induction: Intravenous  PONV Risk Score and Plan: 1 and Propofol infusion and Ondansetron  Airway Management Planned: Natural Airway and Nasal Cannula  Additional Equipment:   Intra-op Plan:   Post-operative Plan:   Informed Consent: I have reviewed the patients History and Physical, chart, labs and discussed the procedure including the risks, benefits and alternatives for the proposed anesthesia with the patient or authorized representative who has indicated his/her understanding and acceptance.     Plan Discussed with: CRNA and Surgeon  Anesthesia Plan Comments:         Anesthesia Quick Evaluation

## 2017-12-30 NOTE — Progress Notes (Signed)
History reviewed and patient examined.  Ms. Samantha Duarte had a probable Realize band placed by Dr. Windy Carina in [place] at [facility] in 2011.  She had some personal problems and has not had the band accessed since about 2012.  She was under the impression the band had no fluid.  Over the last few months she has had increasing trouble with food and was admitted 12/29/2017 with a near complete obstruction at her EG junction.  Abd CT (01/29/2018) scan showed the Realize band in good location, but her esophagus is thickened (1.2 cm) and dilated.  And UGI (01/29/2018) showed high grade luminal narrowing at the level of the gastric band. She underwent an upper endoscopy by Dr. Michail Sermon today who saw esophagitis, dilation of the esophagus, and fluid in the distal esophagus.  I met her tonight and reviewed her story.  She has lost over 100 pounds since the Realize band was placed.  Note, when patients have weight loss operations reversed, most patients gain most of their weight back.  I discussed this with her.  I accessed her Realize band port and aspirated 4 cc of fluid.  She was able to swallow a small amount of water after I removed the fluid.  Plan: 1)  With fluid removed, we'll see how she does.  She can have sips from the floor, but no tray yet.  If we can advance her diet reasonably, she can see [place] back as an outpatient, and discuss long term plans.  2)  If she sees no relief with the fluid aspirated, we will have to consider Realize band removal during this hospitalization.  Alphonsa Overall, MD, [facility] Surgery Pager: [phone] Office phone:  [phone]

## 2017-12-30 NOTE — Anesthesia Postprocedure Evaluation (Signed)
Anesthesia Post Note  Patient: Samantha Duarte  Procedure(s) Performed: ESOPHAGOGASTRODUODENOSCOPY (EGD) WITH PROPOFOL (N/A )     Patient location during evaluation: PACU Anesthesia Type: MAC Level of consciousness: awake and alert and oriented Pain management: pain level controlled Vital Signs Assessment: post-procedure vital signs reviewed and stable Respiratory status: spontaneous breathing, nonlabored ventilation and respiratory function stable Cardiovascular status: stable and blood pressure returned to baseline Postop Assessment: no apparent nausea or vomiting Anesthetic complications: no    Last Vitals:  Vitals:   12/30/17 1234 12/30/17 1240  BP: (!) 105/59 122/78  Pulse: (!) 102   Resp: (!) 22   Temp: 36.8 C   SpO2: 100%     Last Pain:  Vitals:   12/30/17 1240  TempSrc:   PainSc: 0-No pain                 Kanyla Omeara A.

## 2017-12-31 DIAGNOSIS — K2900 Acute gastritis without bleeding: Secondary | ICD-10-CM

## 2017-12-31 DIAGNOSIS — K209 Esophagitis, unspecified without bleeding: Secondary | ICD-10-CM

## 2017-12-31 DIAGNOSIS — R1310 Dysphagia, unspecified: Secondary | ICD-10-CM

## 2017-12-31 LAB — BASIC METABOLIC PANEL
Anion gap: 8 (ref 5–15)
BUN: 7 mg/dL (ref 6–20)
CHLORIDE: 106 mmol/L (ref 98–111)
CO2: 26 mmol/L (ref 22–32)
CREATININE: 0.85 mg/dL (ref 0.44–1.00)
Calcium: 9.2 mg/dL (ref 8.9–10.3)
GFR calc non Af Amer: >60 mL/min (ref >60)
Glucose, Bld: 131 mg/dL — ABNORMAL HIGH (ref 70–99)
POTASSIUM: 4.2 mmol/L (ref 3.5–5.1)
SODIUM: 140 mmol/L (ref 135–145)

## 2017-12-31 MED ORDER — PANTOPRAZOLE SODIUM 40 MG PO TBEC: 40.0000 mg | DELAYED_RELEASE_TABLET | Freq: Two times a day (BID) | ORAL | 3 refills | Status: DC

## 2017-12-31 MED ORDER — GABAPENTIN 300 MG PO CAPS: 300.0000 mg | ORAL_CAPSULE | Freq: Every day | ORAL | Status: DC

## 2017-12-31 MED ORDER — PANTOPRAZOLE SODIUM 40 MG PO TBEC
40.0000 mg | DELAYED_RELEASE_TABLET | Freq: Two times a day (BID) | ORAL | Status: DC
  Administered 2017-12-31: 40 mg via ORAL
  Filled 2017-12-31: qty 1

## 2017-12-31 MED ORDER — PANTOPRAZOLE SODIUM 40 MG PO TBEC: 40.0000 mg | DELAYED_RELEASE_TABLET | Freq: Once | ORAL | Status: DC

## 2017-12-31 MED ORDER — ESCITALOPRAM OXALATE 20 MG PO TABS
20.0000 mg | ORAL_TABLET | Freq: Every day | ORAL | Status: DC
  Administered 2017-12-31: 20 mg via ORAL
  Filled 2017-12-31: qty 1

## 2017-12-31 MED ORDER — PANTOPRAZOLE SODIUM 40 MG PO TBEC
40.0000 mg | DELAYED_RELEASE_TABLET | Freq: Once | ORAL | Status: DC
  Filled 2017-12-31: qty 1

## 2017-12-31 MED ORDER — FAMOTIDINE 20 MG PO TABS
20.0000 mg | ORAL_TABLET | Freq: Two times a day (BID) | ORAL | Status: DC
  Administered 2017-12-31: 20 mg via ORAL
  Filled 2017-12-31: qty 1

## 2017-12-31 MED ORDER — BUSPIRONE HCL 10 MG PO TABS
10.0000 mg | ORAL_TABLET | Freq: Every day | ORAL | Status: DC
  Administered 2017-12-31: 10 mg via ORAL
  Filled 2017-12-31: qty 1

## 2017-12-31 MED ORDER — DIPHENHYDRAMINE HCL 25 MG PO CAPS: 25.0000 mg | ORAL_CAPSULE | Freq: Four times a day (QID) | ORAL | Status: DC | PRN

## 2017-12-31 NOTE — Progress Notes (Signed)
Samantha Duarte [id number] [birth year]  CARE TEAM:  PCP: Briscoe Deutscher, DO  Outpatient Care Team: Patient Care Team: Briscoe Deutscher, DO as PCP - General (Family Medicine)  Inpatient Treatment Team: Treatment Team: Attending Provider: Charlynne Cousins, MD; Consulting Physician: Edison Pace, Md, MD; Consulting Physician: Wilford Corner, MD; Rounding Team: Joycelyn Das, MD; Registered Nurse: Barrington Ellison, RN; Consulting Physician: Alphonsa Overall, MD; Technician: Evon Slack, NT; Student Nurse: Howell Rucks; Case Manager: Dessa Phi, RN   Problem List:   Principal Problem:   Acute gastritis Active Problems:   Anxiety   History of laparoscopic adjustable gastric banding   Intractable nausea and vomiting   Acute esophagitis   1 Day Post-Op  12/29/2017 - 12/30/2017  Procedure(s): ESOPHAGOGASTRODUODENOSCOPY (EGD) WITH PROPOFOL  Hospital Stay = 1 days  Assessment  Dysphasia markedly improved after rest of fluid could be removed out of the LAP-BAND by Dr. Lucia Gaskins  Plan:  -Liquid diet.  Focus mainly on clears and then eventually dysphagia 1/full liquids.  Go slowly.  Continue proton pump inhibitor for esophagitis/gastritis per gastroenterology.  Patient feels much better and wishes to go home with close follow-up with Dr. Lucia Gaskins concerning LAP-BAND management/troubleshooting.  She has plan to follow-up with gastroenterology.  Dr. Michail Sermon.  Make sure esophagitis/gastritis has improved as well.  -VTE prophylaxis- SCDs, etc -mobilize as tolerated to help recovery  15 minutes spent in review, evaluation, examination, counseling, and coordination of care.  More than 50% of that time was spent in counseling.  12/31/2017    Subjective: (Chief complaint)  Feels much better after fluid removed.  Tolerating clear liquids well.  Wishes to try some full liquids.  Wishes to go home.  And per primary service to discharge if tolerates lunch  Husband  and nurse in room.  Objective:  Vital signs:  Vitals:   12/30/17 1357 12/30/17 1925 12/31/17 0553 12/31/17 0628  BP: 131/84 (!) 145/98 (!) 130/107 116/84  Pulse: 81 76 78   Resp: '20 18 20   '$ Temp: 98.8 F (37.1 C) 98.4 F (36.9 C) 98.6 F (37 C)   TempSrc: Oral Oral Oral   SpO2: 100% 100% 100%     Last BM Date: 12/30/17  Intake/Output   Yesterday:  10/26 0701 - 10/27 0700 In: 2671 [I.V.:2833.3; IV Piggyback:826.7] Out: -  This shift:  Total I/O In: 240 [P.O.:240] Out: -   Bowel function:  Flatus: YES  BM:  No  Drain: (No drain)   Physical Exam:  General: Pt awake/alert/oriented x4 in no acute distress.  Smiling.  Bright.  Alert.  Feels much better.   Eyes: PERRL, normal EOM.  Sclera clear.  No icterus Neuro: CN II-XII intact w/o focal sensory/motor deficits. Lymph: No head/neck/groin lymphadenopathy Psych:  No delerium/psychosis/paranoia HENT: Normocephalic, Mucus membranes moist.  No thrush Neck: Supple, No tracheal deviation Chest: No chest wall pain w good excursion CV:  Pulses intact.  Regular rhythm MS: Normal AROM mjr joints.  No obvious deformity  Abdomen: Soft.  Nondistended.  Nontender.  No evidence of peritonitis.  No incarcerated hernias.  Ext:  No deformity.  No mjr edema.  No cyanosis Skin: No petechiae / purpura  Results:   Labs: Results for orders placed or performed during the hospital encounter of 12/29/17 (from the past 48 hour(s))  HIV antibody (Routine Testing)     Status: None   Collection Time: 12/29/17  6:26 PM  Result Value Ref Range   HIV Screen 4th Generation wRfx Non Reactive Non  Reactive    Comment: (NOTE) Performed At: [facility] [place], [place] [id number] Rush Farmer MD [id number]   Basic metabolic panel     Status: Abnormal   Collection Time: 12/29/17  6:26 PM  Result Value Ref Range   Sodium 141 135 - 145 mmol/L   Potassium 3.3 (L) 3.5 - 5.1 mmol/L   Chloride 106 98 - 111 mmol/L    CO2 27 22 - 32 mmol/L   Glucose, Bld 102 (H) 70 - 99 mg/dL   BUN 9 6 - 20 mg/dL   Creatinine, Ser 0.73 0.44 - 1.00 mg/dL   Calcium 8.9 8.9 - 10.3 mg/dL   GFR calc non Af Amer >60 >60 mL/min   GFR calc Af Amer >60 >60 mL/min    Comment: (NOTE) The eGFR has been calculated using the CKD EPI equation. This calculation has not been validated in all clinical situations. eGFR's persistently <60 mL/min signify possible Chronic Kidney Disease.    Anion gap 8 5 - 15    Comment: Performed at [facility], [place] [address] [place], [place] [address]  Glucose, capillary     Status: None   Collection Time: 12/29/17  8:28 PM  Result Value Ref Range   Glucose-Capillary 91 70 - 99 mg/dL  Glucose, capillary     Status: Abnormal   Collection Time: 12/30/17 12:30 AM  Result Value Ref Range   Glucose-Capillary 103 (H) 70 - 99 mg/dL  Magnesium     Status: None   Collection Time: 12/30/17  5:24 AM  Result Value Ref Range   Magnesium 2.0 1.7 - 2.4 mg/dL    Comment: Performed at [facility], [place] [address] [place], [place] [address]  CBC     Status: Abnormal   Collection Time: 12/30/17  5:24 AM  Result Value Ref Range   WBC 5.0 4.0 - 10.5 K/uL   RBC 4.03 3.87 - 5.11 MIL/uL   Hemoglobin 13.8 12.0 - 15.0 g/dL   HCT 40.9 36.0 - 46.0 %   MCV 101.5 (H) 80.0 - 100.0 fL   MCH 34.2 (H) 26.0 - 34.0 pg   MCHC 33.7 30.0 - 36.0 g/dL   RDW 13.6 11.5 - 15.5 %   Platelets 256 150 - 400 K/uL   nRBC 0.0 0.0 - 0.2 %    Comment: Performed at [facility], [place] [address] [place], [place] [address]  Basic metabolic panel     Status: Abnormal   Collection Time: 12/31/17  5:48 AM  Result Value Ref Range   Sodium 140 135 - 145 mmol/L   Potassium 4.2 3.5 - 5.1 mmol/L    Comment: DELTA CHECK NOTED NO VISIBLE HEMOLYSIS    Chloride 106 98 - 111 mmol/L   CO2 26 22 - 32 mmol/L   Glucose, Bld 131 (H) 70 - 99 mg/dL   BUN 7 6 - 20 mg/dL   Creatinine, Ser 0.85  0.44 - 1.00 mg/dL   Calcium 9.2 8.9 - 10.3 mg/dL   GFR calc non Af Amer >60 >60 mL/min   GFR calc Af Amer >60 >60 mL/min    Comment: (NOTE) The eGFR has been calculated using the CKD EPI equation. This calculation has not been validated in all clinical situations. eGFR's persistently <60 mL/min signify possible Chronic Kidney Disease.    Anion gap 8 5 - 15    Comment: Performed at [facility], [place] [address] [place], [place] [address]  Imaging / Studies: Ct Abdomen Pelvis W Contrast  Result Date: 12/29/2017 CLINICAL DATA:  Acute generalized abdominal pain. EXAM: CT ABDOMEN AND PELVIS WITH CONTRAST TECHNIQUE: Multidetector CT imaging of the abdomen and pelvis was performed using the standard protocol following bolus administration of intravenous contrast. CONTRAST:  178m ISOVUE-300 IOPAMIDOL (ISOVUE-300) INJECTION 61% COMPARISON:  None. FINDINGS: Lower chest: Mild sliding-type hiatal hernia is noted. Visualized lung bases are unremarkable. Hepatobiliary: No focal liver abnormality is seen. Status post cholecystectomy. No biliary dilatation. Pancreas: Unremarkable. No pancreatic ductal dilatation or surrounding inflammatory changes. Spleen: Normal in size without focal abnormality. Adrenals/Urinary Tract: Adrenal glands are unremarkable. Kidneys are normal, without renal calculi, focal lesion, or hydronephrosis. Bladder is unremarkable. Stomach/Bowel: Status post lap band procedure. This appears to be in grossly good position. There is no evidence of bowel obstruction or inflammation. The appendix appears normal. Vascular/Lymphatic: No significant vascular findings are present. No enlarged abdominal or pelvic lymph nodes. Reproductive: Uterus and bilateral adnexa are unremarkable. Other: No abdominal wall hernia or abnormality. No abdominopelvic ascites. Musculoskeletal: No acute or significant osseous findings. IMPRESSION: Status post lap band procedure. This appears to be in  grossly good position. Mild sliding-type hiatal hernia. No other abnormality seen in the abdomen or pelvis. Electronically Signed   By: JMarijo Conception M.D.   On: 12/29/2017 11:39   Dg UDuanne LimerickW/water Sol Cm  Result Date: 12/29/2017 CLINICAL DATA:  Nausea and vomiting for 1 day. History of lap band in 2011 EXAM: WATER SOLUBLE UPPER GI SERIES TECHNIQUE: Single-column upper GI series was performed using water soluble contrast. COMPARISON:  CT 12/29/2017 FLUOROSCOPY TIME:  Fluoroscopy Time:  1 minutes 24 seconds Radiation Exposure Index (if provided by the fluoroscopic device): 52.3 mGy Number of Acquired Spot Images: 0 FINDINGS: The scout radiograph shows the lap band within the expected location just below the GE junction. The band is oriented in the expected 2 o'clock to 8 o'clock position. The band port and catheter appear intact. In the upright orientation the patient ingested approximately 25 cc of water-soluble contrast material. This promptly passed to the distal esophagus which appears increased in caliber which measures up to 5.3 cm in diameter. There is marked narrowing of the lumen through the lap band measuring approximately 2 mm in diameter. There is marked stasis of the water-soluble contrast material within the dilated distal esophagus. Several episodes of a thin, hairline, channel of contrast passed into the stomach. At least 1 episode of vomiting was witnessed during this exam. IMPRESSION: 1. High grade luminal narrowing of the gastric lumen at the level of the gastric band which results in marked stasis of water-soluble contrast material within the dilated distal esophagus. On the comparison CT from today there is thickening of the distal esophageal wall and mucosa within the band. On the CT the esophageal wall thickness measures up to 1.2 cm. No mass is identified on the CT from today. Findings may reflect changes due to severe esophagitis. Electronically Signed   By: TKerby MoorsM.D.   On:  12/29/2017 17:16    Medications / Allergies: per chart  Antibiotics: Anti-infectives (From admission, onward)   None        Note: Portions of this report may have been transcribed using voice recognition software. Every effort was made to ensure accuracy; however, inadvertent computerized transcription errors may be present.   Any transcriptional errors that result from this process are unintentional.     SAdin Hector MD, FACS, MASCRS Gastrointestinal and Minimally  Invasive Surgery    [address] [place], [place] [address] [phone] Main / Paging [phone] Fax

## 2017-12-31 NOTE — Discharge Summary (Addendum)
Physician Discharge Summary  Samantha Duarte ZOX:[id number] DOB: December 01, 1970 DOA: 12/29/2017  PCP: Helane Rima, DO  Admit date: 12/29/2017 Discharge date: 12/31/2017  Admitted From: home Disposition:  Home  Recommendations for Outpatient Follow-up:  1. Follow up with general surgery as an outpatient in 3 to 4 weeks. 2. Follow-up with GI 2 weeks for biopsy results.   Home Health:No Equipment/Devices:none  Discharge Condition:stable CODE STATUS:full Diet recommendation: Heart Healthy   Brief/Interim Summary: 47 y.o. female history of laparoscopic gastric banding in 2018, cholecystectomy, admitted with intractable nausea vomiting and abdominal pain, she reports that her last bowel movement was a day prior to admission she has had no lattice.  Denies any dysphagia odynophagia some squeezing epigastric pain  Discharge Diagnoses:  Principal Problem:   Acute gastritis Active Problems:   Anxiety   History of laparoscopic adjustable gastric banding   Intractable nausea and vomiting   Acute esophagitis She was placed on Zofran for her nausea and n.p.o., surgery evaluated the patient the recommended an upper GI series that did not show any obstruction. GI was consulted recommended an endoscopy that was done on 12/29/2017  dilated esophagus with fluid a grade B esophagitis and congested mucosa in the cardia with acute gastritis. General surgery removed about 5cc of fluid form band and she had immediate relieve.  We change her Protonix to p.o. twice daily which she will continue as an outpatient. She will follow-up with GI for biopsy results in 2 to 4 weeks. She will follow-up with surgery Dr. Ezzard Standing as an outpatient in 3 to 4 weeks.   Discharge Instructions  Discharge Instructions    Diet - low sodium heart healthy   Complete by:  As directed    Increase activity slowly   Complete by:  As directed      Allergies as of 12/31/2017      Reactions   Morphine And Related Nausea  Only      Medication List    STOP taking these medications   cyclobenzaprine 5 MG tablet Commonly known as:  FLEXERIL     TAKE these medications   busPIRone 10 MG tablet Commonly known as:  BUSPAR TAKE 1 TABLET (10 MG TOTAL) BY MOUTH 3 (THREE) TIMES DAILY. What changed:  when to take this   escitalopram 20 MG tablet Commonly known as:  LEXAPRO TAKE 1 TABLET (20 MG TOTAL) BY MOUTH DAILY.   gabapentin 300 MG capsule Commonly known as:  NEURONTIN Start with 1 tab po qhs X 1 week, then increase to 1 tab po bid X 1 week then 1 tab po tid prn What changed:    how much to take  how to take this  when to take this  additional instructions   pantoprazole 40 MG tablet Commonly known as:  PROTONIX Take 1 tablet (40 mg total) by mouth 2 (two) times daily.       Allergies  Allergen Reactions  . Morphine And Related Nausea Only    Consultations:  General surgery  Gastroenterology.   Procedures/Studies: Ct Abdomen Pelvis W Contrast  Result Date: 12/29/2017 CLINICAL DATA:  Acute generalized abdominal pain. EXAM: CT ABDOMEN AND PELVIS WITH CONTRAST TECHNIQUE: Multidetector CT imaging of the abdomen and pelvis was performed using the standard protocol following bolus administration of intravenous contrast. CONTRAST:  ISOVUE-300 IOPAMIDOL (ISOVUE-300) INJECTION 61% COMPARISON:  None. FINDINGS: Lower chest: Mild sliding-type hiatal hernia is noted. Visualized lung bases are unremarkable. Hepatobiliary: No focal liver abnormality is seen. Status post cholecystectomy. No  biliary dilatation. Pancreas: Unremarkable. No pancreatic ductal dilatation or surrounding inflammatory changes. Spleen: Normal in size without focal abnormality. Adrenals/Urinary Tract: Adrenal glands are unremarkable. Kidneys are normal, without renal calculi, focal lesion, or hydronephrosis. Bladder is unremarkable. Stomach/Bowel: Status post lap band procedure. This appears to be in grossly good position.  There is no evidence of bowel obstruction or inflammation. The appendix appears normal. Vascular/Lymphatic: No significant vascular findings are present. No enlarged abdominal or pelvic lymph nodes. Reproductive: Uterus and bilateral adnexa are unremarkable. Other: No abdominal wall hernia or abnormality. No abdominopelvic ascites. Musculoskeletal: No acute or significant osseous findings. IMPRESSION: Status post lap band procedure. This appears to be in grossly good position. Mild sliding-type hiatal hernia. No other abnormality seen in the abdomen or pelvis. Electronically Signed   By: Lupita Raider, M.D.   On: 12/29/2017 11:39   Dg Kayleen Memos W/water Sol Cm  Result Date: 12/29/2017 CLINICAL DATA:  Nausea and vomiting for 1 day. History of lap band in 2011 EXAM: WATER SOLUBLE UPPER GI SERIES TECHNIQUE: Single-column upper GI series was performed using water soluble contrast. COMPARISON:  CT 12/29/2017 FLUOROSCOPY TIME:  Fluoroscopy Time:  1 minutes 24 seconds Radiation Exposure Index (if provided by the fluoroscopic device): 52.3 mGy Number of Acquired Spot Images: 0 FINDINGS: The scout radiograph shows the lap band within the expected location just below the GE junction. The band is oriented in the expected 2 o'clock to 8 o'clock position. The band port and catheter appear intact. In the upright orientation the patient ingested approximately 25 cc of water-soluble contrast material. This promptly passed to the distal esophagus which appears increased in caliber which measures up to 5.3 cm in diameter. There is marked narrowing of the lumen through the lap band measuring approximately 2 mm in diameter. There is marked stasis of the water-soluble contrast material within the dilated distal esophagus. Several episodes of a thin, hairline, channel of contrast passed into the stomach. At least 1 episode of vomiting was witnessed during this exam. IMPRESSION: 1. High grade luminal narrowing of the gastric lumen at the  level of the gastric band which results in marked stasis of water-soluble contrast material within the dilated distal esophagus. On the comparison CT from today there is thickening of the distal esophageal wall and mucosa within the band. On the CT the esophageal wall thickness measures up to 1.2 cm. No mass is identified on the CT from today. Findings may reflect changes due to severe esophagitis. Electronically Signed   By: Signa Kell M.D.   On: 12/29/2017 17:16   Xr C-arm No Report  Result Date: 12/25/2017 Please see Notes tab for imaging impression.  Xr C-arm No Report  Result Date: 12/08/2017 Please see Notes tab for imaging impression.  Subjective: She relates she feels great she was able to tolerate her breakfast and her lunch relating that is this is back to normal.  Discharge Exam: Vitals:   12/31/17 0553 12/31/17 0628  BP: (!) 130/107 116/84  Pulse: 78   Resp: 20   Temp: 98.6 F (37 C)   SpO2: 100%    Vitals:   12/30/17 1357 12/30/17 1925 12/31/17 0553 12/31/17 0628  BP: 131/84 (!) 145/98 (!) 130/107 116/84  Pulse: 81 76 78   Resp: 20 18 20    Temp: 98.8 F (37.1 C) 98.4 F (36.9 C) 98.6 F (37 C)   TempSrc: Oral Oral Oral   SpO2: 100% 100% 100%     General: Pt is alert, awake,  not in acute distress Cardiovascular: RRR, S1/S2 +, no rubs, no gallops Respiratory: CTA bilaterally, no wheezing, no rhonchi Abdominal: Soft, NT, ND, bowel sounds + Extremities: no edema, no cyanosis    The results of significant diagnostics from this hospitalization (including imaging, microbiology, ancillary and laboratory) are listed below for reference.     Microbiology: No results found for this or any previous visit (from the past 240 hour(s)).   Labs: BNP (last 3 results) No results for input(s): BNP in the last 8760 hours. Basic Metabolic Panel: Recent Labs  Lab 12/29/17 0853 12/29/17 1826 12/30/17 0524 12/31/17 0548  NA 140 141  --  140  K 3.2* 3.3*  --  4.2   CL 104 106  --  106  CO2 27 27  --  26  GLUCOSE 102* 102*  --  131*  BUN 12 9  --  7  CREATININE 0.86 0.73  --  0.85  CALCIUM 9.2 8.9  --  9.2  MG  --   --  2.0  --    Liver Function Tests: Recent Labs  Lab 12/29/17 0853  AST 27  ALT 18  ALKPHOS 60  BILITOT 0.9  PROT 6.7  ALBUMIN 3.9   Recent Labs  Lab 12/29/17 0853  LIPASE 32   No results for input(s): AMMONIA in the last 168 hours. CBC: Recent Labs  Lab 12/29/17 0853 12/30/17 0524  WBC 5.5 5.0  NEUTROABS 2.6  --   HGB 12.9 13.8  HCT 38.7 40.9  MCV 101.8* 101.5*  PLT 214 256   Cardiac Enzymes: No results for input(s): CKTOTAL, CKMB, CKMBINDEX, TROPONINI in the last 168 hours. BNP: Invalid input(s): POCBNP CBG: Recent Labs  Lab 12/29/17 2028 12/30/17 0030  GLUCAP 91 103*   D-Dimer No results for input(s): DDIMER in the last 72 hours. Hgb A1c No results for input(s): HGBA1C in the last 72 hours. Lipid Profile No results for input(s): CHOL, HDL, LDLCALC, TRIG, CHOLHDL, LDLDIRECT in the last 72 hours. Thyroid function studies No results for input(s): TSH, T4TOTAL, T3FREE, THYROIDAB in the last 72 hours.  Invalid input(s): FREET3 Anemia work up No results for input(s): VITAMINB12, FOLATE, FERRITIN, TIBC, IRON, RETICCTPCT in the last 72 hours. Urinalysis No results found for: COLORURINE, APPEARANCEUR, LABSPEC, PHURINE, GLUCOSEU, HGBUR, BILIRUBINUR, KETONESUR, PROTEINUR, UROBILINOGEN, NITRITE, LEUKOCYTESUR Sepsis Labs Invalid input(s): PROCALCITONIN,  WBC,  LACTICIDVEN Microbiology No results found for this or any previous visit (from the past 240 hour(s)).   Time coordinating discharge: 40 minutes  SIGNED:   Marinda Elk, MD  [facility] 12/31/2017, 10:04 AM Pager   If 7PM-7AM, please contact night-coverage [email or web address] Password [email or web address]

## 2017-12-31 NOTE — Progress Notes (Signed)
[facility] Gastroenterology Progress Note  Samantha Duarte 47 y.o. Nov 06, 1970   Subjective: Feels much better since lap band fluid was aspirated yesterday by Dr. Ezzard Standing. Tolerating clear liquid diet. Denies abdominal pain, nausea, or vomiting. In great spirits this morning.  Objective: Vital signs: Vitals:   12/31/17 0553 12/31/17 0628  BP: (!) 130/107 116/84  Pulse: 78   Resp: 20   Temp: 98.6 F (37 C)   SpO2: 100%     Physical Exam: Gen: alert, no acute distress, well-nourished HEENT: anicteric sclera CV: RRR Chest: CTA B Abd: minimal epigastric tenderness without guarding, soft, nondistended, +BS   Lab Results: Recent Labs    12/29/17 1826 12/30/17 0524 12/31/17 0548  NA 141  --  140  K 3.3*  --  4.2  CL 106  --  106  CO2 27  --  26  GLUCOSE 102*  --  131*  BUN 9  --  7  CREATININE 0.73  --  0.85  CALCIUM 8.9  --  9.2  MG  --  2.0  --    Recent Labs    12/29/17 0853  AST 27  ALT 18  ALKPHOS 60  BILITOT 0.9  PROT 6.7  ALBUMIN 3.9   Recent Labs    12/29/17 0853 12/30/17 0524  WBC 5.5 5.0  NEUTROABS 2.6  --   HGB 12.9 13.8  HCT 38.7 40.9  MCV 101.8* 101.5*  PLT 214 256      Assessment/Plan: Functional obstruction from lap band that seems to have resolved since aspiration of 4 cc of fluid from band by Dr. Ezzard Standing last evening. Tolerating clear liquid diet. Advance per surgery recs. At discharge needs to change to PPI PO BID for 1 month and then decrease to QD but will discuss further at her f/u with me to be scheduled for 3-4 weeks. Will sign off. Call us back if questions.   Shirley Friar 12/31/2017, 9:29 AM  Questions please call [phone] ID: Samantha Duarte, female   DOB: Aug 04, 1970, 47 y.o.   MRN: [id number]

## 2017-12-31 NOTE — Progress Notes (Signed)
Pt leaving this evening with her husband. Alert, oriented, and without c/o. Discharge instructions/prescriptions given/explained with pt verbalizing understanding.  Pt aware of diet and to followup with provider.

## 2018-01-01 ENCOUNTER — Encounter (HOSPITAL_COMMUNITY): Payer: Self-pay | Admitting: Gastroenterology

## 2018-01-03 NOTE — Progress Notes (Signed)
Samantha Duarte - 47 y.o. female MRN [id number]  Date of birth: 07-Apr-1970  Office Visit Note: Visit Date: 12/25/2017 PCP: Helane Rima, DO Referred by: Helane Rima, DO  Subjective: Chief Complaint  Patient presents with  . Lower Back - Pain   HPI:  Samantha Duarte is a 47 y.o. female who comes in today For planned possible repeat right L5 transforaminal epidural steroid injection.  She has been followed by Dr. Gaspar Bidding and her primary care physician Dr. Odelia Gage.  She reports chronic worsening pain in the right lower back that radiates into the right leg with numbness in the right leg and pretty classic L5 and S1 distribution.  This all began after a very long 9-hour flight to [place] in July 2019 and really has not gotten any better.  She reports walking makes the pain worse and ibuprofen can make it a little bit better.  Right L5 transforaminal injection performed on October 4 gave her almost 70% relief total.  The numbness and paresthesias are almost gone but she still having significant pain that is limiting.  Her pain level is down to a 4 from an 8.  We are going to repeat the injection today and she will continue to follow-up with Dr. Gaspar Bidding.  ROS Otherwise per HPI.  Assessment & Plan: Visit Diagnoses:  1. Lumbar radiculopathy     Plan: No additional findings.   Meds & Orders:  Meds ordered this encounter  Medications  . betamethasone acetate-betamethasone sodium phosphate (CELESTONE) injection 12 mg    Orders Placed This Encounter  Procedures  . XR C-ARM NO REPORT  . Epidural Steroid injection    Follow-up: Return if symptoms worsen or fail to improve.   Procedures: No procedures performed  Lumbosacral Transforaminal Epidural Steroid Injection - Sub-Pedicular Approach with Fluoroscopic Guidance  Patient: Samantha Duarte      Date of Birth: January 24, 1971 MRN: [id number] PCP: Helane Rima, DO      Visit Date: 12/25/2017   Universal Protocol:      Date/Time: 12/25/2017  Consent Given By: the patient  Position: PRONE  Additional Comments: Vital signs were monitored before and after the procedure. Patient was prepped and draped in the usual sterile fashion. The correct patient, procedure, and site was verified.   Injection Procedure Details:  Procedure Site One Meds Administered:  Meds ordered this encounter  Medications  . betamethasone acetate-betamethasone sodium phosphate (CELESTONE) injection 12 mg    Laterality: Right  Location/Site:  L5-S1  Needle size: 22 G  Needle type: Spinal  Needle Placement: Transforaminal  Findings:    -Comments: Excellent flow of contrast along the nerve and into the epidural space.  Procedure Details: After squaring off the end-plates to get a true AP view, the C-arm was positioned so that an oblique view of the foramen as noted above was visualized. The target area is just inferior to the "nose of the scotty dog" or sub pedicular. The soft tissues overlying this structure were infiltrated with 2-3 ml. of 1% Lidocaine without Epinephrine.  The spinal needle was inserted toward the target using a "trajectory" view along the fluoroscope beam.  Under AP and lateral visualization, the needle was advanced so it did not puncture dura and was located close the 6 O'Clock position of the pedical in AP tracterory. Biplanar projections were used to confirm position. Aspiration was confirmed to be negative for CSF and/or blood. A 1-2 ml. volume of Isovue-250 was injected and flow of contrast was noted at  each level. Radiographs were obtained for documentation purposes.   After attaining the desired flow of contrast documented above, a 0.5 to 1.0 ml test dose of 0.25% Marcaine was injected into each respective transforaminal space.  The patient was observed for 90 seconds post injection.  After no sensory deficits were reported, and normal lower extremity motor function was noted,   the above  injectate was administered so that equal amounts of the injectate were placed at each foramen (level) into the transforaminal epidural space.   Additional Comments:  The patient tolerated the procedure well Dressing: Band-Aid    Post-procedure details: Patient was observed during the procedure. Post-procedure instructions were reviewed.  Patient left the clinic in stable condition.     Clinical History: MRI LUMBAR SPINE WITHOUT CONTRAST  TECHNIQUE: Multiplanar, multisequence MR imaging of the lumbar spine was performed. No intravenous contrast was administered.  COMPARISON:  Lumbar radiographs 10/18/2017.  FINDINGS: Segmentation:  Normal on the comparison.  Alignment:  Normal lumbar lordosis.  No spondylolisthesis.  Vertebrae: There is low level marrow edema in the bilateral L4-L5 posterior elements (series 8, image 11 on the left and image 4 on the right). On the right side the L5 pedicle is affected. This appears to be degenerative in nature, see additional details below. Background bone marrow signal is normal. No other acute osseous abnormality identified. Intact visible sacrum and SI joints.  Conus medullaris and cauda equina: Conus extends to the L1 level. No lower spinal cord or conus signal abnormality.  Paraspinal and other soft tissues: Negative.  Disc levels:  T11-T12: Negative.  T12-L1:  Negative.  L1-L2:  Negative.  L2-L3:  Negative disc.  Borderline to mild facet hypertrophy.  L3-L4: Negative disc. Mild facet and ligament flavum hypertrophy. No significant stenosis.  L4-L5: Negative disc. Moderate to severe bilateral facet and ligament flavum hypertrophy. Subsequent mild mass effect on the posterior thecal sac but no spinal stenosis. However, there is mild asymmetric stenosis at the right lateral recess (right L5 nerve level). Trace degenerative facet joint fluid on the right. There are small bilateral posteriorly situated  synovial cysts which should not cause neural compromise.  L5-S1: Negative disc. Mild to moderate bilateral facet hypertrophy. No stenosis.  IMPRESSION: 1. The dominant finding is advanced bilateral facet arthropathy at L4-L5 with mild associated degenerative appearing marrow edema. Questionable associated stenosis at the right lateral recess of L4-L5, query right L5 radiculitis. 2. Normal underlying lumbar discs. Mild to moderate lower lumbar facet hypertrophy elsewhere.   Electronically Signed   By: Odessa Fleming M.D.   On: 11/01/2017 11:38     Objective:  VS:  HT:    WT:   BMI:     BP:(!) 129/93  HR:(!) 102bpm  TEMP:98.8 F (37.1 C)(Oral)  RESP:  Physical Exam  Ortho Exam Imaging: No results found.

## 2018-01-03 NOTE — Procedures (Signed)
Lumbosacral Transforaminal Epidural Steroid Injection - Sub-Pedicular Approach with Fluoroscopic Guidance  Patient: Samantha Duarte      Date of Birth: [birth year] MRN: [id number] PCP: Helane Rima, DO      Visit Date: 12/25/2017   Universal Protocol:    Date/Time: 12/25/2017  Consent Given By: the patient  Position: PRONE  Additional Comments: Vital signs were monitored before and after the procedure. Patient was prepped and draped in the usual sterile fashion. The correct patient, procedure, and site was verified.   Injection Procedure Details:  Procedure Site One Meds Administered:  Meds ordered this encounter  Medications  . betamethasone acetate-betamethasone sodium phosphate (CELESTONE) injection 12 mg    Laterality: Right  Location/Site:  L5-S1  Needle size: 22 G  Needle type: Spinal  Needle Placement: Transforaminal  Findings:    -Comments: Excellent flow of contrast along the nerve and into the epidural space.  Procedure Details: After squaring off the end-plates to get a true AP view, the C-arm was positioned so that an oblique view of the foramen as noted above was visualized. The target area is just inferior to the "nose of the scotty dog" or sub pedicular. The soft tissues overlying this structure were infiltrated with 2-3 ml. of 1% Lidocaine without Epinephrine.  The spinal needle was inserted toward the target using a "trajectory" view along the fluoroscope beam.  Under AP and lateral visualization, the needle was advanced so it did not puncture dura and was located close the 6 O'Clock position of the pedical in AP tracterory. Biplanar projections were used to confirm position. Aspiration was confirmed to be negative for CSF and/or blood. A 1-2 ml. volume of Isovue-250 was injected and flow of contrast was noted at each level. Radiographs were obtained for documentation purposes.   After attaining the desired flow of contrast documented above, a 0.5 to  1.0 ml test dose of 0.25% Marcaine was injected into each respective transforaminal space.  The patient was observed for 90 seconds post injection.  After no sensory deficits were reported, and normal lower extremity motor function was noted,   the above injectate was administered so that equal amounts of the injectate were placed at each foramen (level) into the transforaminal epidural space.   Additional Comments:  The patient tolerated the procedure well Dressing: Band-Aid    Post-procedure details: Patient was observed during the procedure. Post-procedure instructions were reviewed.  Patient left the clinic in stable condition.

## 2018-01-05 ENCOUNTER — Ambulatory Visit: Payer: 59 | Admitting: Family Medicine

## 2018-01-05 ENCOUNTER — Ambulatory Visit: Payer: 59 | Admitting: Sports Medicine

## 2018-01-08 ENCOUNTER — Ambulatory Visit: Payer: 59 | Admitting: Sports Medicine

## 2018-01-08 ENCOUNTER — Encounter: Payer: Self-pay | Admitting: Sports Medicine

## 2018-01-08 ENCOUNTER — Ambulatory Visit (INDEPENDENT_AMBULATORY_CARE_PROVIDER_SITE_OTHER): Payer: 59 | Admitting: Sports Medicine

## 2018-01-08 VITALS — BP 130/82 | HR 102 | Ht 64.0 in | Wt 150.8 lb

## 2018-01-08 DIAGNOSIS — M5416 Radiculopathy, lumbar region: Secondary | ICD-10-CM

## 2018-01-08 DIAGNOSIS — M21371 Foot drop, right foot: Secondary | ICD-10-CM

## 2018-01-08 NOTE — Patient Instructions (Addendum)
Also check out [employer]" which is a program developed by Dr. Myles Lipps.   There are links to a couple of his YouTube Videos below and I would like to see performing one of his videos 5-6 days per week.    A good intro video is: "Independence from Pain 7-minute Video" - [email or web address]   Exercises that focus more on the neck are as below: Dr. Derrill Kay with [employer] teaching neck and shoulder details Part 1 - https://[email or web address].be/[email or web address] Part 2 Dr. Derrill Kay with [facility], The quick routine to practice daily - https://youtu.be/[email or web address]  Do not try to attempt the entire video when first beginning.    Try breaking of each exercise that he goes into shorter segments.  Otherwise if they perform an exercise for 45 seconds, start with 15 seconds and rest and then resume when they begin the new activity.  If you work your way up to being able to do these videos without having to stop, I expect you will see significant improvements in your pain.  If you enjoy his videos and would like to find out more you can look on his website: [email or web address].  He has a workout streaming option as well as a DVD set available for purchase.  Amazon has the best price for his DVDs.      Pilates PT or PT Pilates - [facility] Body Balance Pilates

## 2018-01-08 NOTE — Progress Notes (Signed)
Samantha Duarte. Samantha Duarte Sports Medicine [facility] at [facility] [phone]  Samantha Duarte - 47 y.o. female MRN [id number]  Date of birth: [birth year]  Visit Date: 01/08/2018  PCP: Helane Rima, DO   Referred by: Helane Rima, DO  Scribe(s) for today's visit: Christoper Fabian, LAT, ATC  SUBJECTIVE:  Samantha Duarte is here for Follow-up (Low back pain w/ R LE radiculopathy)   10/18/2017: Her low back and R LE symptoms INITIALLY: Began about 2 weeks ago after a long plane ride from [place] when she started having back pain w/ radiating pain into her R post thigh and R ant tibia and foot.  The radiating R LE pain has resolved but she now notes some weakness in her R ankle dorsiflexors.  She also reports almost falling several times.  She states that in trying to prevent herself from falling, she jarred her L hip which is also currently bothering her. Described as 6/10 dull,achy throbbing pain in the L hip and R lumbar spine, nonradiating Worsened with walking Improved with rest and Toradol Additional associated symptoms include: N/T into R LE and R ankle DF weakness; no change in B/B and no change in symptoms w/ coughing/sneezing   At this time symptoms are slightly better due to improved R ankle DF strength compared to onset. She has been taking Flexeril 5mg  and Toradol 10 mg.  She has finished her prednisone.  11/23/2017: Compared to the last office visit, her previously described symptoms are worsening. She has numbness in both feet, all toes on R foot and 1-2 toes on left foot. LBP is constant and dull. She denies sharp shooting pain. She reports increased falling and weakness.  Current symptoms are moderate-severe & are radiating to both lower extremities.  She has been taking Aleve BID with some relief. She ran out of flexeril but did find it beneficial while she was taking it.   01/08/2018: Compared to the last office visit on 11/23/17, her previously described  low back and R LE symptoms are improving w/ much improved R LE symptoms.  She notes that she is still having midline back pain but is not having any issues w/ her R LE at this point. Current symptoms are mild & are nonradiating.  Her N/T has resolved in her R LE. She has been taking Gabapentin 300 mg qd at night.  She saw Dr. Alvester Morin on 12/08/17 and 12/25/17 for a R L5-S1 epidural.  Pt states that she has been taking Motrin prn.  L-spine XR - 10/18/17 L-spine MRI - 11/01/17  REVIEW OF SYSTEMS: Denies night time disturbances. Denies fevers, chills, or night sweats. Denies unexplained weight loss. Denies personal history of cancer. Denies changes in bowel or bladder habits. Denies recent unreported falls. Denies new or worsening dyspnea or wheezing. Denies headaches or dizziness.  Denies numbness, tingling or weakness  In the extremities - in the R LE Denies dizziness or presyncopal episodes Denies lower extremity edema    HISTORY:  Prior history reviewed and updated per electronic medical record.  Social History   Occupational History  . Not on file  Tobacco Use  . Smoking status: Current Every Day Smoker    Packs/day: 0.50    Types: Cigarettes  . Smokeless tobacco: Never Used  Substance and Sexual Activity  . Alcohol use: Yes    Alcohol/week: 14.0 standard drinks    Types: 14 Glasses of wine per week  . Drug use: No  . Sexual activity: Yes  Partners: Male   Social History   Social History Narrative  . Not on file    DATA OBTAINED & REVIEWED:  No results for input(s): HGBA1C, LABURIC, CREATINE in the last 8760 hours. Marland Kitchen MRI 10/24/2017: Bilateral facet arthropathy most notable at L4-5 with questionable stenosis at the right neuroforamina may be causing L5 radiculitis.  Normal-appearing lumbar disks with mild degenerative changes throughout the lumbar spine.  OBJECTIVE:  VS:  HT:5\' 4"  (162.6 cm)   WT:150 lb 12.8 oz (68.4 kg)  BMI:25.87    BP:130/82  HR:(!) 102bpm   TEMP: ( )  RESP:99 %   PHYSICAL EXAM: CONSTITUTIONAL: Well-developed, Well-nourished and In no acute distress PSYCHIATRIC: Alert & appropriately interactive. and Not depressed or anxious appearing. RESPIRATORY: No increased work of breathing and Trachea Midline EYES: Pupils are equal., EOM intact without nystagmus. and No scleral icterus.  VASCULAR EXAM: Warm and well perfused NEURO: unremarkable  MSK Exam: BACK Exam: Normal alignment & Contours Skin: No overlying erythema/ecchymosis  MOTOR TESTING: Intact in all LE myotomes and Able to heel and toe walk without difficutly        RIGHT    LEFT Straight leg raise-------------------------: normal, no pain                         normal, no pain Braggard Stretch Test------------------: normal, no pain                         normal, no pain Slump Sign--------------------------------: normal, no pain                         normal, no pain Popliteal compression test------------: normal, no pain                         normal, no pain    REFLEXES Right Left  DTR - L3/4 -Patellar 1+ 1+  DTR - L5/S1 - Achilles 1+ 1+     ASSESSMENT   1. Foot drop, right   2. Lumbar back pain with radiculopathy affecting right lower extremity      PROCEDURES:  None  PLAN:  Pertinent additional documentation may be included in corresponding procedure notes, imaging studies, problem based documentation and patient instructions.  No problem-specific Assessment & Plan notes found for this encounter.  She is doing remarkably better following the epidural steroid injection.  Links to [employer] provided today per Patient Instructions.  These exercises were developed by Myles Lipps, DC with a strong emphasis on core neuromuscular reducation and postural realignment through body-weight exercises.  and Continue previously prescribed home exercise program.   Discussed the underlying features of tight hip flexors leading to  crouched, fetal like position that results in spinal column compression.  Including lumbar hyperflexion with hypermobility, thoracic flexion with restrictive rotation and cervical lordosis reversal.   Activity modifications and the importance of avoiding exacerbating activities (limiting pain to no more than a 4 / 10 during or following activity) recommended and discussed.  Discussed red flag symptoms that warrant earlier emergent evaluation and patient voices understanding.  Discussed the options for home therapeutic exercises versus formal core strengthening exercises and she is interested in pursuing Pilates PT.  Information for this as well as body balance Pilates was provided today.  Return if symptoms worsen or fail to improve.     CMA/ATC served as Neurosurgeon during this  visit. History, Physical, and Plan performed by medical provider. Documentation and orders reviewed and attested to.      Andrena Mews, DO    [place] [facility] Physician

## 2018-01-12 ENCOUNTER — Other Ambulatory Visit: Payer: Self-pay | Admitting: *Deleted

## 2018-01-12 ENCOUNTER — Encounter: Payer: Self-pay | Admitting: *Deleted
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
## 2018-01-13 ENCOUNTER — Other Ambulatory Visit: Payer: Self-pay | Admitting: *Deleted
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
## 2018-01-19 DIAGNOSIS — Z760 Encounter for issue of repeat prescription: Secondary | ICD-10-CM | POA: Diagnosis not present

## 2018-02-06 ENCOUNTER — Other Ambulatory Visit: Payer: Self-pay | Admitting: Family Medicine

## 2018-02-06 DIAGNOSIS — F419 Anxiety disorder, unspecified: Secondary | ICD-10-CM
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
## 2018-02-20 ENCOUNTER — Ambulatory Visit (INDEPENDENT_AMBULATORY_CARE_PROVIDER_SITE_OTHER): Payer: 59 | Admitting: Family Medicine

## 2018-02-20 VITALS — BP 144/82 | HR 98 | Temp 98.0°F | Ht 64.0 in | Wt 167.0 lb

## 2018-02-20 DIAGNOSIS — N951 Menopausal and female climacteric states: Secondary | ICD-10-CM | POA: Diagnosis not present

## 2018-02-20 DIAGNOSIS — Z1239 Encounter for other screening for malignant neoplasm of breast: Secondary | ICD-10-CM

## 2018-02-20 DIAGNOSIS — Z9884 Bariatric surgery status: Secondary | ICD-10-CM

## 2018-02-20 DIAGNOSIS — Z8719 Personal history of other diseases of the digestive system: Secondary | ICD-10-CM

## 2018-02-20 DIAGNOSIS — F419 Anxiety disorder, unspecified: Secondary | ICD-10-CM

## 2018-02-20 DIAGNOSIS — M5416 Radiculopathy, lumbar region: Secondary | ICD-10-CM

## 2018-02-20 DIAGNOSIS — F17210 Nicotine dependence, cigarettes, uncomplicated: Secondary | ICD-10-CM | POA: Diagnosis not present

## 2018-02-20 MED ORDER — BUSPIRONE HCL 10 MG PO TABS: 10.0000 mg | ORAL_TABLET | Freq: Every day | ORAL | 0 refills | Status: DC

## 2018-02-20 MED ORDER — ESCITALOPRAM OXALATE 20 MG PO TABS: 20.0000 mg | ORAL_TABLET | Freq: Every day | ORAL | 1 refills | Status: DC

## 2018-02-20 NOTE — Patient Instructions (Signed)
You have an appointment scheduled for: []   2D Mammogram  [x]   3D Mammogram  []   Bone Density   Call one of the below locations to make appointment.   Your appointment will at the following location  []   [facility]      [address]       [place], [place]        [phone]         []   [facility]  [address]   [place], [place]  [phone]   Make sure to wear two peace clothing  No lotions powders or deodorants the day of the appointment Make sure to bring picture ID and insurance card.  Bring list of medications you are currently taking including any supplements.

## 2018-02-21 ENCOUNTER — Encounter: Payer: Self-pay | Admitting: Family Medicine

## 2018-03-31 ENCOUNTER — Encounter: Payer: Self-pay | Admitting: Sports Medicine

## 2018-06-04 ENCOUNTER — Other Ambulatory Visit: Payer: Self-pay | Admitting: Sports Medicine

## 2018-08-07 ENCOUNTER — Other Ambulatory Visit: Payer: Self-pay | Admitting: Family Medicine

## 2018-08-07 DIAGNOSIS — F419 Anxiety disorder, unspecified: Secondary | ICD-10-CM

## 2018-08-08 ENCOUNTER — Telehealth: Payer: Self-pay | Admitting: Family Medicine

## 2018-08-08 ENCOUNTER — Other Ambulatory Visit: Payer: Self-pay

## 2018-08-08 DIAGNOSIS — F419 Anxiety disorder, unspecified: Secondary | ICD-10-CM

## 2018-08-08 MED ORDER — PANTOPRAZOLE SODIUM 40 MG PO TBEC: 40.0000 mg | DELAYED_RELEASE_TABLET | Freq: Two times a day (BID) | ORAL | 3 refills | Status: DC

## 2018-08-08 MED ORDER — BUSPIRONE HCL 10 MG PO TABS: 10.0000 mg | ORAL_TABLET | Freq: Every day | ORAL | 0 refills | Status: DC

## 2018-08-08 NOTE — Telephone Encounter (Signed)
Copied from CRM [id number]. Topic: Quick Communication - Rx Refill/Question >> Aug 08, 2018  3:15 PM Gwenlyn Fudge A wrote: Medication: busPIRone (BUSPAR) 10 MG tablet, pantoprazole (PROTONIX) 40 MG tablet  Has the patient contacted their pharmacy? Yes. Pt states she is completely out of first medication and she is going out of town tomorrow. Please advise.  (Agent: If no, request that the patient contact the pharmacy for the refill.) (Agent: If yes, when and what did the pharmacy advise?)  Preferred Pharmacy (with phone number or street name): [facility] - [place], [place] - [address] [facility]. [address] [place] [address] Phone: [phone] Fax: [phone] Not a 24 hour pharmacy; exact hours not known.    Agent: Please be advised that RX refills may take up to 3 business days. We ask that you follow-up with your pharmacy.

## 2018-08-08 NOTE — Telephone Encounter (Signed)
See request °

## 2018-08-08 NOTE — Telephone Encounter (Signed)
Called in.

## 2018-08-09 ENCOUNTER — Other Ambulatory Visit: Payer: Self-pay | Admitting: Family Medicine

## 2018-08-09 DIAGNOSIS — F419 Anxiety disorder, unspecified: Secondary | ICD-10-CM

## 2018-08-09 NOTE — Telephone Encounter (Signed)
Copied from CRM [id number]. Topic: Quick Communication - Rx Refill/Question >> Aug 08, 2018  3:15 PM Gwenlyn Fudge A wrote: Medication: busPIRone (BUSPAR) 10 MG tablet, pantoprazole (PROTONIX) 40 MG tablet  Has the patient contacted their pharmacy? Yes.   (Agent: If no, request that the patient contact the pharmacy for the refill.) (Agent: If yes, when and what did the pharmacy advise?)  Preferred Pharmacy (with phone number or street name): [facility] - [place], [place] - [address] [facility] [place] [address] Phone: [phone] Fax: [phone] Not a 24 hour pharmacy; exact hours not known.    Agent: Please be advised that RX refills may take up to 3 business days. We ask that you follow-up with your pharmacy.

## 2018-08-10 MED ORDER — PANTOPRAZOLE SODIUM 40 MG PO TBEC: 40.0000 mg | DELAYED_RELEASE_TABLET | Freq: Two times a day (BID) | ORAL | 3 refills | Status: DC

## 2018-08-10 MED ORDER — BUSPIRONE HCL 10 MG PO TABS: 10.0000 mg | ORAL_TABLET | Freq: Every day | ORAL | 0 refills | Status: DC

## 2018-08-19 NOTE — Progress Notes (Signed)
ERROR

## 2018-08-20 ENCOUNTER — Encounter: Payer: 59 | Admitting: Family Medicine

## 2018-08-20 ENCOUNTER — Other Ambulatory Visit: Payer: Self-pay

## 2018-12-20 ENCOUNTER — Other Ambulatory Visit: Payer: Self-pay | Admitting: Family Medicine

## 2018-12-20 DIAGNOSIS — F419 Anxiety disorder, unspecified: Secondary | ICD-10-CM

## 2019-04-02 ENCOUNTER — Other Ambulatory Visit: Payer: Self-pay | Admitting: Family Medicine

## 2019-04-02 DIAGNOSIS — F419 Anxiety disorder, unspecified: Secondary | ICD-10-CM

## 2019-04-24 ENCOUNTER — Telehealth: Payer: Self-pay | Admitting: Family Medicine

## 2019-04-24 NOTE — Telephone Encounter (Signed)
Please call pt and reschedule her for this week, in office or virtual. We can not refill medications till she is seen per [facility].

## 2019-04-24 NOTE — Telephone Encounter (Signed)
Scheduled for this Friday virtually.

## 2019-04-24 NOTE — Telephone Encounter (Signed)
  LAST APPOINTMENT DATE: 04/02/2019   NEXT APPOINTMENT DATE:@3 /12/2019  MEDICATION:gabapentin (NEURONTIN) 300 MG capsulegabapentin (NEURONTIN) 300 MG capsule-busPIRone (BUSPAR) 10 MG tablet  PHARMACY:[facility] - [place], [place] - [address].  **Let patient know to contact pharmacy at the end of the day to make sure medication is ready. **  ** Please notify patient to allow 48-72 hours to process**  **Encourage patient to contact the pharmacy for refills or they can request refills through [facility]**  CLINICAL FILLS OUT ALL BELOW:   LAST REFILL:  QTY:  REFILL DATE:    OTHER COMMENTS:    Okay for refill?  Please advise

## 2019-04-25 NOTE — Progress Notes (Signed)
Virtual Visit via Video   I connected with Samantha Duarte on 04/26/19 at 11:20 AM EST by a video enabled telemedicine application and verified that I am speaking with the correct person using two identifiers. Location patient: Home Location provider: [facility], Office Persons participating in the virtual visit: Maycel Nyoka Cowden PA-C, Samantha Pickler, LPN   I discussed the limitations of evaluation and management by telemedicine and the availability of in person appointments. The patient expressed understanding and agreed to proceed.  I acted as a Education administrator for [employer], [employer], LPN  Subjective:   HPI:  Pt following up for transfer of care from Dr. Juleen China and medication refills.  Anxiety Pt is currently taking Buspar 10 mg daily, Lexapro 20 mg. Pt feels well on medications and feels like it is helping her. Denies SI/HI. Has been out of her Buspar for 1-2 weeks.   Wt Readings from Last 4 Encounters:  04/26/19 175 lb (79.4 kg)  02/20/18 167 lb (75.8 kg)  01/08/18 150 lb 12.8 oz (68.4 kg)  11/23/17 149 lb 6.4 oz (67.8 kg)    Back pain Pt having lower back pain with right sciatica. Taking Gabapentin for leg pain, takes 300 mg nightly. Needs refill. Denies concerns for saddle anesthesia, bowel/bladder incontinence, unusual numbness/tingling.  ROS: See pertinent positives and negatives per HPI.  Patient Active Problem List   Diagnosis Date Noted  . Menopausal state 02/20/2018  . Dysphagia improved s/p all LapBand fluid removed 12/30/2017 12/31/2017  . Intractable nausea and vomiting 12/29/2017  . Bilious vomiting with nausea   . Foot drop, right 11/23/2017  . Lumbar back pain with radiculopathy affecting right lower extremity 11/23/2017  . Nicotine dependence 09/29/2016  . Anxiety 09/29/2016  . History of laparoscopic adjustable gastric banding 09/29/2016    Social History   Tobacco Use  . Smoking status: Former Smoker    Packs/day: 0.50     Types: Cigarettes    Quit date: 03/11/2019    Years since quitting: 0.1  . Smokeless tobacco: Never Used  Substance Use Topics  . Alcohol use: Yes    Alcohol/week: 14.0 standard drinks    Types: 14 Glasses of wine per week    Current Outpatient Medications:  .  busPIRone (BUSPAR) 10 MG tablet, Take 1 tablet (10 mg total) by mouth daily., Disp: 90 tablet, Rfl: 1 .  escitalopram (LEXAPRO) 20 MG tablet, Take 1 tablet (20 mg total) by mouth daily., Disp: 90 tablet, Rfl: 1 .  gabapentin (NEURONTIN) 300 MG capsule, Take 1 capsule orally, up to TID or as directed, Disp: 90 capsule, Rfl: 1  Allergies  Allergen Reactions  . Morphine And Related Nausea Only    Objective:   VITALS: Per patient if applicable, see vitals. GENERAL: Alert, appears well and in no acute distress. HEENT: Atraumatic, conjunctiva clear, no obvious abnormalities on inspection of external nose and ears. NECK: Normal movements of the head and neck. CARDIOPULMONARY: No increased WOB. Speaking in clear sentences. I:E ratio WNL.  MS: Moves all visible extremities without noticeable abnormality. PSYCH: Pleasant and cooperative, well-groomed. Speech normal rate and rhythm. Affect is appropriate. Insight and judgement are appropriate. Attention is focused, linear, and appropriate.  NEURO: CN grossly intact. Oriented as arrived to appointment on time with no prompting. Moves both UE equally.  SKIN: No obvious lesions, wounds, erythema, or cyanosis noted on face or hands.  Assessment and Plan:   [name] was seen today for transfer of care and anxiety.  Diagnoses and all orders for this visit:  Anxiety Doing well on current regimen. Refill for 6 months -- follow-up in the summer in the office for a CPE, sooner if concerns. Patient is agreeable to plan. Orders: -     busPIRone (BUSPAR) 10 MG tablet; Take 1 tablet (10 mg total) by mouth daily. -     escitalopram (LEXAPRO) 20 MG tablet; Take 1 tablet (20 mg total) by mouth  daily.  Lumbar back pain with radiculopathy Stable when on medication. Will refill gabapentin 300 mg nightly. No red flags on discussion. Follow-up in the summer in the office for CPE, sooner if concerns.   Other orders -     gabapentin (NEURONTIN) 300 MG capsule; Take 1 capsule orally, up to TID or as directed  . Reviewed expectations re: course of current medical issues. . Discussed self-management of symptoms. . Outlined signs and symptoms indicating need for more acute intervention. . Patient verbalized understanding and all questions were answered. Marland Kitchen Health Maintenance issues including appropriate healthy diet, exercise, and smoking avoidance were discussed with patient. . See orders for this visit as documented in the electronic medical record.  I discussed the assessment and treatment plan with the patient. The patient was provided an opportunity to ask questions and all were answered. The patient agreed with the plan and demonstrated an understanding of the instructions.   The patient was advised to call back or seek an in-person evaluation if the symptoms worsen or if the condition fails to improve as anticipated.   CMA or LPN served as scribe during this visit. History, Physical, and Plan performed by medical provider. The above documentation has been reviewed and is accurate and complete.   [place], [place] 04/26/2019

## 2019-04-26 ENCOUNTER — Encounter: Payer: Self-pay | Admitting: Physician Assistant

## 2019-04-26 ENCOUNTER — Other Ambulatory Visit: Payer: Self-pay

## 2019-04-26 ENCOUNTER — Ambulatory Visit (INDEPENDENT_AMBULATORY_CARE_PROVIDER_SITE_OTHER): Payer: Self-pay | Admitting: Physician Assistant

## 2019-04-26 VITALS — Ht 64.0 in | Wt 175.0 lb

## 2019-04-26 DIAGNOSIS — F419 Anxiety disorder, unspecified: Secondary | ICD-10-CM

## 2019-04-26 DIAGNOSIS — M5416 Radiculopathy, lumbar region: Secondary | ICD-10-CM

## 2019-04-26 MED ORDER — GABAPENTIN 300 MG PO CAPS: ORAL_CAPSULE | ORAL | 1 refills | Status: AC

## 2019-04-26 MED ORDER — ESCITALOPRAM OXALATE 20 MG PO TABS: 20.0000 mg | ORAL_TABLET | Freq: Every day | ORAL | 1 refills | Status: AC

## 2019-04-26 MED ORDER — BUSPIRONE HCL 10 MG PO TABS: 10.0000 mg | ORAL_TABLET | Freq: Every day | ORAL | 1 refills | Status: AC

## 2019-05-15 ENCOUNTER — Encounter: Payer: Self-pay | Admitting: Physician Assistant

## 2019-05-15 NOTE — Progress Notes (Deleted)
Samantha Duarte is a 49 y.o. female is here to discuss:  SCRIBE STATEMENT  History of Present Illness:   No chief complaint on file.   HPI  Health Maintenance Due  Topic Date Due  . PAP SMEAR-Modifier  08/17/2018  . INFLUENZA VACCINE  10/06/2018    Past Medical History:  Diagnosis Date  . Anxiety   . Depression   . History of laparoscopic adjustable gastric banding 2011  . Tobacco use 09/29/2016     Social History   Socioeconomic History  . Marital status: Married    Spouse name: Not on file  . Number of children: Not on file  . Years of education: Not on file  . Highest education level: Not on file  Occupational History  . Not on file  Tobacco Use  . Smoking status: Former Smoker    Packs/day: 0.50    Types: Cigarettes    Quit date: 03/11/2019    Years since quitting: 0.1  . Smokeless tobacco: Never Used  Substance and Sexual Activity  . Alcohol use: Yes    Alcohol/week: 14.0 standard drinks    Types: 14 Glasses of wine per week  . Drug use: No  . Sexual activity: Yes    Partners: Male  Other Topics Concern  . Not on file  Social History Narrative  . Not on file   Social Determinants of Health   Financial Resource Strain:   . Difficulty of Paying Living Expenses: Not on file  Food Insecurity:   . Worried About Charity fundraiser in the Last Year: Not on file  . Ran Out of Food in the Last Year: Not on file  Transportation Needs:   . Lack of Transportation (Medical): Not on file  . Lack of Transportation (Non-Medical): Not on file  Physical Activity:   . Days of Exercise per Week: Not on file  . Minutes of Exercise per Session: Not on file  Stress:   . Feeling of Stress : Not on file  Social Connections:   . Frequency of Communication with Friends and Family: Not on file  . Frequency of Social Gatherings with Friends and Family: Not on file  . Attends Religious Services: Not on file  . Active Member of Clubs or Organizations: Not on file  .  Attends Archivist Meetings: Not on file  . Marital Status: Not on file  Intimate Partner Violence:   . Fear of Current or Ex-Partner: Not on file  . Emotionally Abused: Not on file  . Physically Abused: Not on file  . Sexually Abused: Not on file    Past Surgical History:  Procedure Laterality Date  . CHOLECYSTECTOMY    . ESOPHAGOGASTRODUODENOSCOPY (EGD) WITH PROPOFOL N/A 12/30/2017   Procedure: ESOPHAGOGASTRODUODENOSCOPY (EGD) WITH PROPOFOL;  Surgeon: Wilford Corner, MD;  Location: [facility];  Service: Endoscopy;  Laterality: N/A;  . LAPAROSCOPIC ROUX-EN-Y GASTRIC BYPASS WITH UPPER ENDOSCOPY AND REMOVAL OF LAP BAND      No family history on file.  PMHx, SurgHx, SocialHx, FamHx, Medications, and Allergies were reviewed in the Visit Navigator and updated as appropriate.   Patient Active Problem List   Diagnosis Date Noted  . Menopausal state 02/20/2018  . Dysphagia improved s/p all LapBand fluid removed 12/30/2017 12/31/2017  . Intractable nausea and vomiting 12/29/2017  . Bilious vomiting with nausea   . Foot drop, right 11/23/2017  . Lumbar back pain with radiculopathy affecting right lower extremity 11/23/2017  . Nicotine dependence 09/29/2016  .  Anxiety 09/29/2016  . History of laparoscopic adjustable gastric banding 09/29/2016    Social History   Tobacco Use  . Smoking status: Former Smoker    Packs/day: 0.50    Types: Cigarettes    Quit date: 03/11/2019    Years since quitting: 0.1  . Smokeless tobacco: Never Used  Substance Use Topics  . Alcohol use: Yes    Alcohol/week: 14.0 standard drinks    Types: 14 Glasses of wine per week  . Drug use: No    Current Medications and Allergies:    Current Outpatient Medications:  .  busPIRone (BUSPAR) 10 MG tablet, Take 1 tablet (10 mg total) by mouth daily., Disp: 90 tablet, Rfl: 1 .  escitalopram (LEXAPRO) 20 MG tablet, Take 1 tablet (20 mg total) by mouth daily., Disp: 90 tablet, Rfl: 1 .  gabapentin  (NEURONTIN) 300 MG capsule, Take 1 capsule orally, up to TID or as directed, Disp: 90 capsule, Rfl: 1  Allergies  Allergen Reactions  . Morphine And Related Nausea Only    Review of Systems   ROS  Vitals:  There were no vitals filed for this visit.   There is no height or weight on file to calculate BMI.   Physical Exam:    Physical Exam   Assessment and Plan:    There are no diagnoses linked to this encounter.  . Reviewed expectations re: course of current medical issues. . Discussed self-management of symptoms. . Outlined signs and symptoms indicating need for more acute intervention. . Patient verbalized understanding and all questions were answered. . See orders for this visit as documented in the electronic medical record. . Patient received an After Visit Summary.  ***  Jarold Motto, PA-C [place], [facility] [place] 05/15/2019  Follow-up: No follow-ups on file.

## 2019-07-05 ENCOUNTER — Telehealth: Payer: Self-pay | Admitting: Physician Assistant

## 2019-07-05 DIAGNOSIS — Z9884 Bariatric surgery status: Secondary | ICD-10-CM

## 2019-07-05 NOTE — Telephone Encounter (Signed)
Judeth Cornfield, I placed the order for the referral.

## 2019-07-05 NOTE — Telephone Encounter (Signed)
Pt called stating she has an appointment with Dr. Ezzard Standing today at 2:30pm for a lap band adjustment. Pt was told she has to have a referral with her insurance [employer]) to see an in-network provider. Pt is asking if Lelon Mast can write her a referral before she goes in for her appointment today. Please advise.

## 2019-09-29 IMAGING — CT CT ABD-PELV W/ CM
3 of 5 series · 17 of 46 positions shown, 19 images · IV contrast (ISOVUE)
Comparison: None.

CLINICAL DATA: Acute generalized abdominal pain.

EXAM:
CT ABDOMEN AND PELVIS WITH CONTRAST
TECHNIQUE: Multidetector CT imaging of the abdomen and pelvis was performed
using the standard protocol following bolus administration of
intravenous contrast.
CONTRAST:  100mL [id number] IOPAMIDOL (W863EF-OKK) INJECTION 61%

[Series 2: axial st · axial · 0.67mm/px · z∈[-383,-48]mm · 12 of 81 slices shown, 14 images]
[im 7/81  soft-tissue]
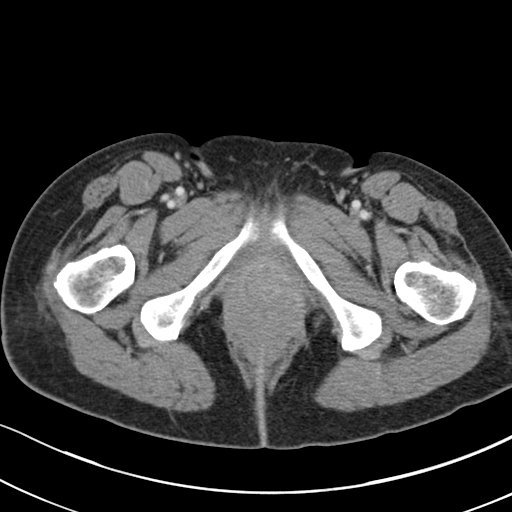
[im 7/81  bone]
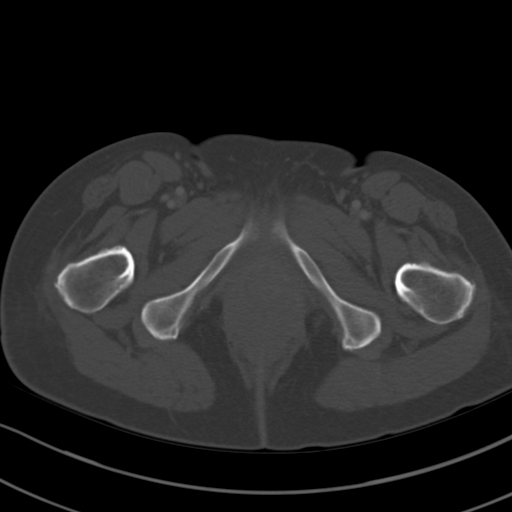
[im 13/81  soft-tissue]
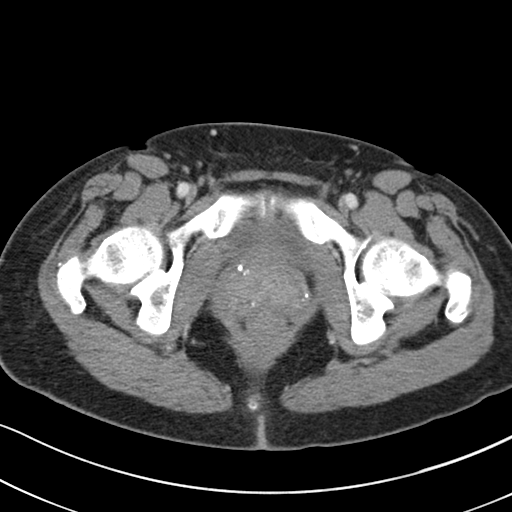
[im 19/81  soft-tissue]
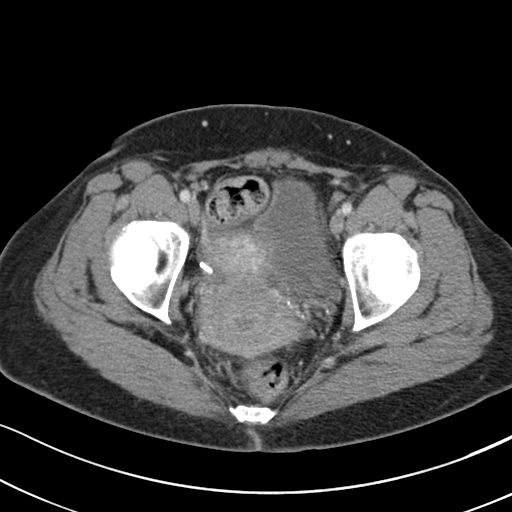
[im 25/81  soft-tissue]
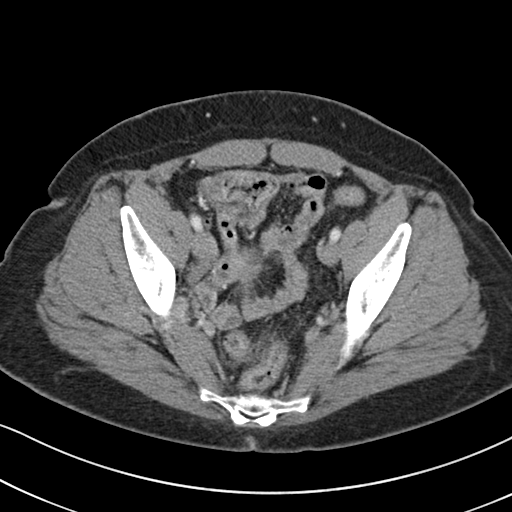
[im 31/81  soft-tissue]
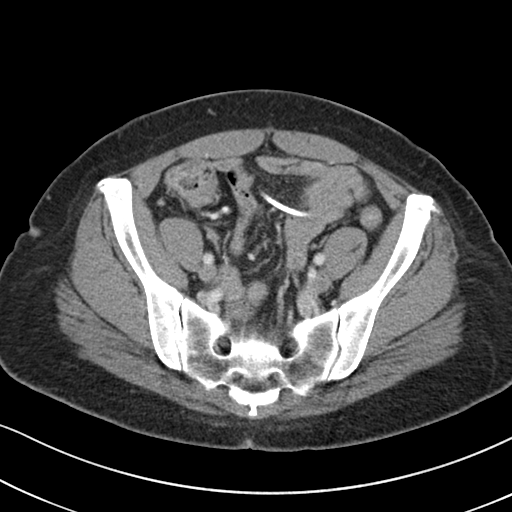
[im 37/81  soft-tissue]
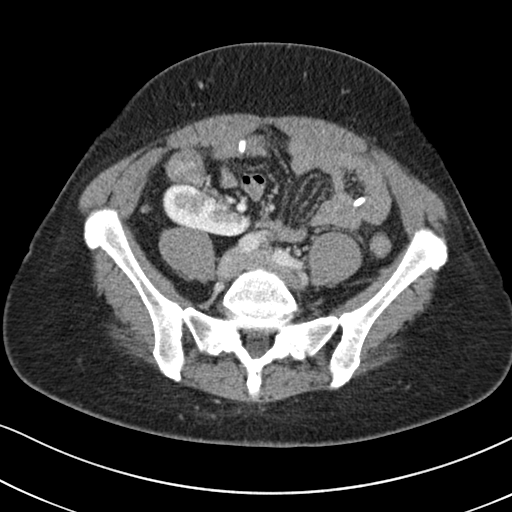
[im 44/81  soft-tissue]
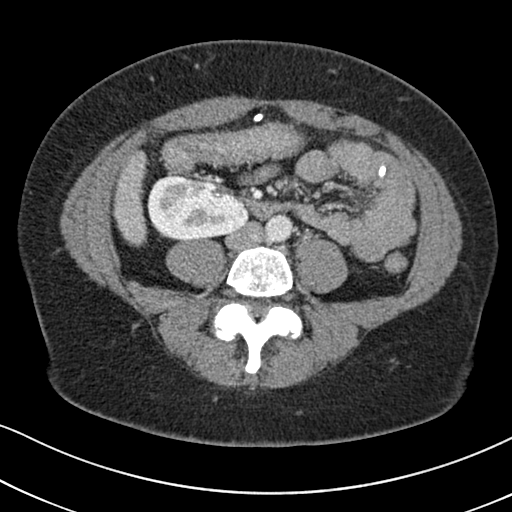
[im 50/81  soft-tissue]
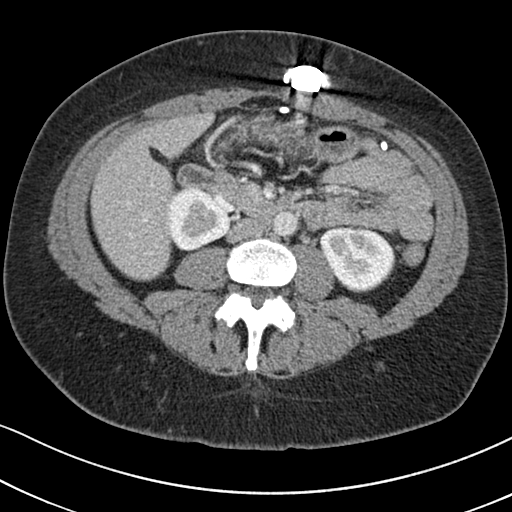
[im 56/81  soft-tissue]
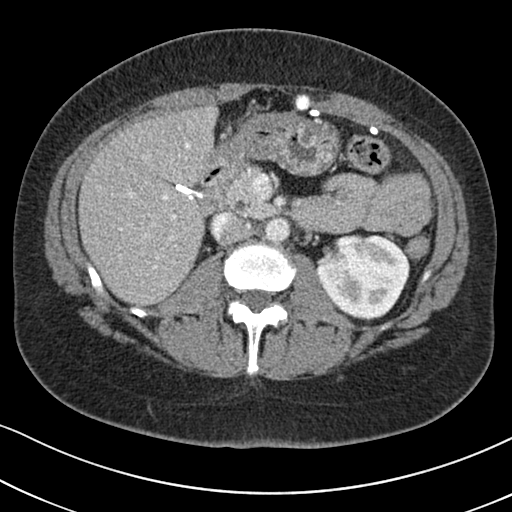
[im 56/81  bone]
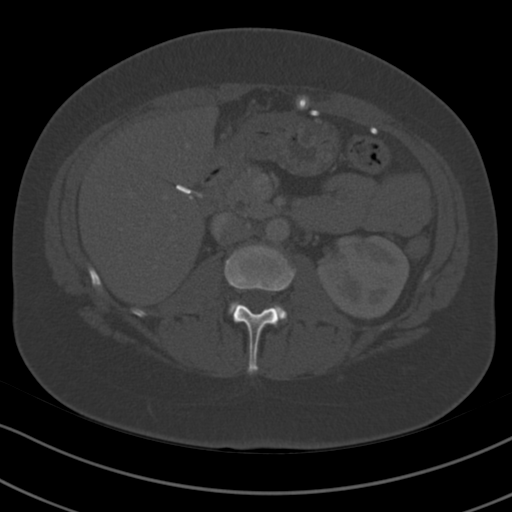
[im 62/81  soft-tissue]
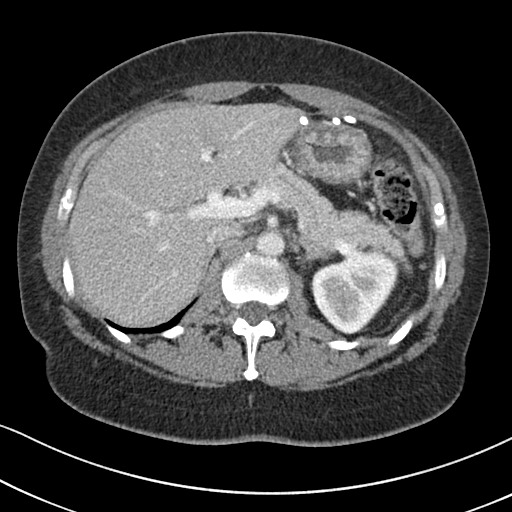
[im 68/81  soft-tissue]
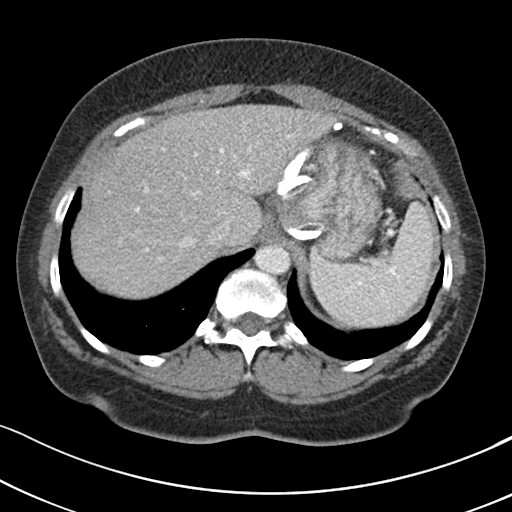
[im 74/81  soft-tissue]
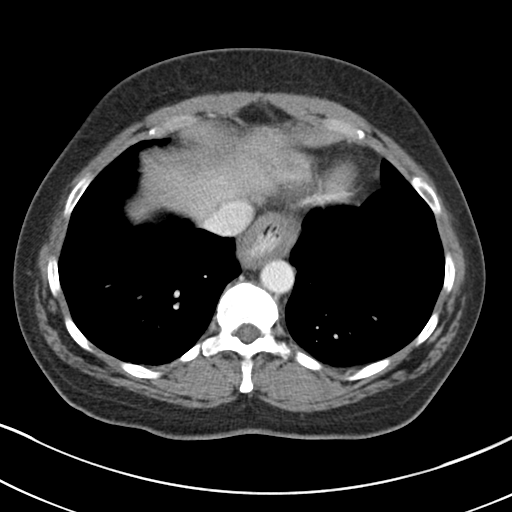

[Series 4: lung bases · axial · 0.67mm/px · z∈[-117,-105]mm · 2 of 58 slices shown]
[im 6/58  bone]
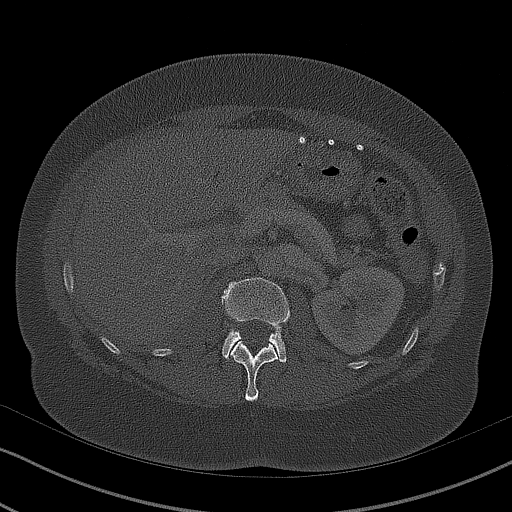
[im 12/58  bone]
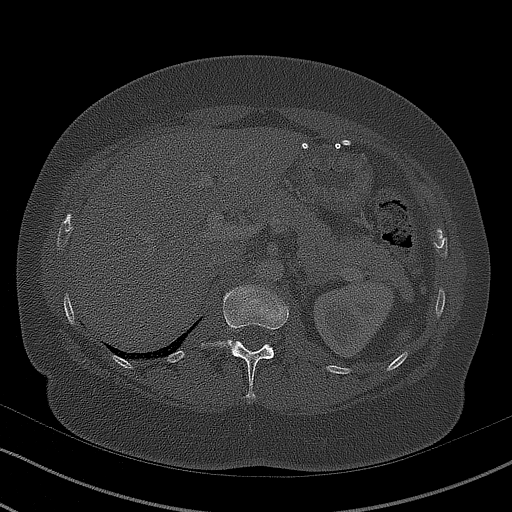

[Series 5: coronal st · coronal · 0.77mm/px · 3 of 103 slices shown]
[im 35/103  soft-tissue]
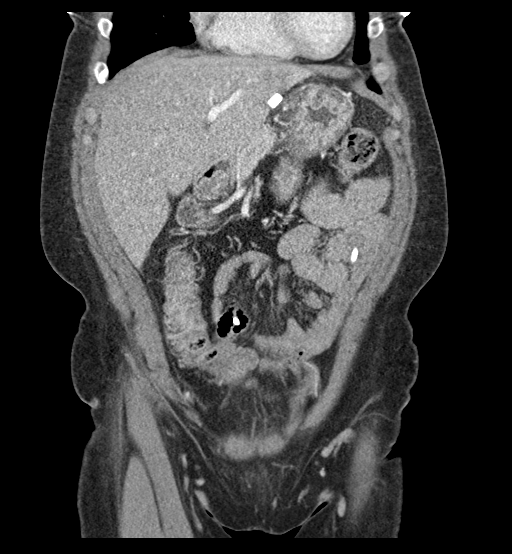
[im 46/103  soft-tissue]
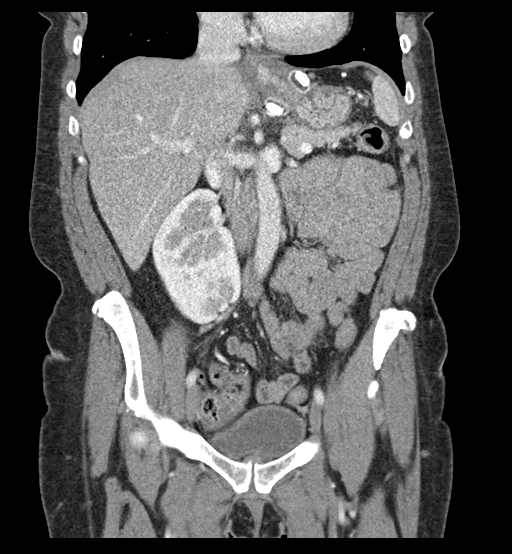
[im 57/103  soft-tissue]
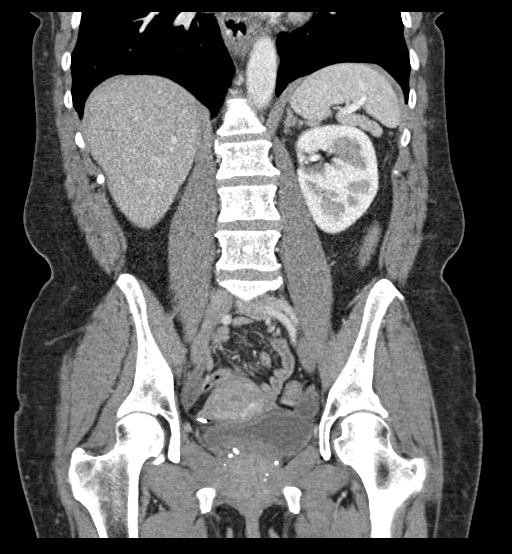

[17 of 46 positions shown; findings below may reference images not displayed]

FINDINGS: Lower chest: Mild sliding-type hiatal hernia is noted. Visualized
lung bases are unremarkable.

Hepatobiliary: No focal liver abnormality is seen. Status post
cholecystectomy. No biliary dilatation.

Pancreas: Unremarkable. No pancreatic ductal dilatation or
surrounding inflammatory changes.

Spleen: Normal in size without focal abnormality.

Adrenals/Urinary Tract: Adrenal glands are unremarkable. Kidneys are
normal, without renal calculi, focal lesion, or hydronephrosis.
Bladder is unremarkable.

Stomach/Bowel: Status post lap band procedure. This appears to be in
grossly good position. There is no evidence of bowel obstruction or
inflammation. The appendix appears normal.

Vascular/Lymphatic: No significant vascular findings are present. No
enlarged abdominal or pelvic lymph nodes.

Reproductive: Uterus and bilateral adnexa are unremarkable.

Other: No abdominal wall hernia or abnormality. No abdominopelvic
ascites.

Musculoskeletal: No acute or significant osseous findings.
IMPRESSION: Status post lap band procedure. This appears to be in grossly good
position.

Mild sliding-type hiatal hernia.

No other abnormality seen in the abdomen or pelvis.

## 2022-10-17 ENCOUNTER — Telehealth: Payer: Self-pay | Admitting: Physician Assistant

## 2022-10-17 NOTE — Telephone Encounter (Signed)
Called patient no answer left VM to call office back to schedule next available cpe.
